# Patient Record
Sex: Female | Born: 1985 | Race: White | Hispanic: No | Marital: Single | Smoking: Current every day smoker
Health system: Southern US, Community
[De-identification: ages and names within clinical notes are randomized; demographics above are authoritative.]

## PROBLEM LIST (undated history)

## (undated) ENCOUNTER — Inpatient Hospital Stay: Payer: Self-pay

## (undated) DIAGNOSIS — K219 Gastro-esophageal reflux disease without esophagitis: Secondary | ICD-10-CM

## (undated) DIAGNOSIS — E119 Type 2 diabetes mellitus without complications: Secondary | ICD-10-CM

## (undated) HISTORY — PX: ABDOMINAL SURGERY: SHX537

## (undated) HISTORY — PX: CHOLECYSTECTOMY: SHX55

---
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
## 2006-08-23 ENCOUNTER — Emergency Department: Payer: Self-pay | Admitting: Emergency Medicine

## 2007-02-28 ENCOUNTER — Observation Stay: Payer: Self-pay | Admitting: Obstetrics and Gynecology

## 2007-03-08 ENCOUNTER — Observation Stay: Payer: Self-pay | Admitting: Obstetrics and Gynecology

## 2007-03-22 ENCOUNTER — Observation Stay: Payer: Self-pay | Admitting: Obstetrics and Gynecology

## 2007-04-06 ENCOUNTER — Inpatient Hospital Stay: Payer: Self-pay | Admitting: Obstetrics and Gynecology

## 2008-08-12 ENCOUNTER — Emergency Department: Payer: Self-pay | Admitting: Emergency Medicine

## 2009-01-15 ENCOUNTER — Emergency Department: Payer: Self-pay

## 2009-03-04 ENCOUNTER — Emergency Department: Payer: Self-pay | Admitting: Emergency Medicine

## 2010-01-21 ENCOUNTER — Emergency Department: Payer: Self-pay | Admitting: Emergency Medicine

## 2010-04-30 ENCOUNTER — Emergency Department: Payer: Self-pay | Admitting: Unknown Physician Specialty

## 2010-11-23 ENCOUNTER — Emergency Department: Payer: Self-pay | Admitting: *Deleted

## 2012-01-14 ENCOUNTER — Emergency Department: Payer: Self-pay | Admitting: Emergency Medicine

## 2012-01-14 LAB — BASIC METABOLIC PANEL
Anion Gap: 10 (ref 7–16)
Calcium, Total: 8.9 mg/dL (ref 8.5–10.1)
Co2: 22 mmol/L (ref 21–32)
Creatinine: 0.63 mg/dL (ref 0.60–1.30)
EGFR (African American): >60
EGFR (Non-African Amer.): >60
Osmolality: 280 (ref 275–301)

## 2012-01-14 LAB — URINALYSIS, COMPLETE
Bacteria: NONE SEEN
Bilirubin,UR: NEGATIVE
Nitrite: NEGATIVE
Specific Gravity: 1.02 (ref 1.003–1.030)
Squamous Epithelial: 22
Transitional Epi: 1

## 2012-01-14 LAB — CBC WITH DIFFERENTIAL/PLATELET
Basophil #: 0.1 10*3/uL (ref 0.0–0.1)
Eosinophil #: 0.2 10*3/uL (ref 0.0–0.7)
Eosinophil %: 1.8 %
MCH: 30.1 pg (ref 26.0–34.0)
MCHC: 34.2 g/dL (ref 32.0–36.0)
MCV: 88 fL (ref 80–100)
Monocyte %: 4.7 %
Neutrophil %: 63.3 %
RBC: 4.74 10*6/uL (ref 3.80–5.20)

## 2012-01-14 LAB — PREGNANCY, URINE: Pregnancy Test, Urine: NEGATIVE m[IU]/mL

## 2012-03-15 ENCOUNTER — Emergency Department: Payer: Self-pay | Admitting: Emergency Medicine

## 2012-03-16 LAB — CBC
HGB: 13.2 g/dL (ref 12.0–16.0)
MCH: 29 pg (ref 26.0–34.0)
MCHC: 33.4 g/dL (ref 32.0–36.0)
Platelet: 291 10*3/uL (ref 150–440)
RDW: 14 % (ref 11.5–14.5)
WBC: 13 10*3/uL — ABNORMAL HIGH (ref 3.6–11.0)

## 2012-03-16 LAB — BASIC METABOLIC PANEL
Anion Gap: 3 — ABNORMAL LOW (ref 7–16)
Calcium, Total: 8.6 mg/dL (ref 8.5–10.1)
Creatinine: 0.79 mg/dL (ref 0.60–1.30)
Glucose: 91 mg/dL (ref 65–99)
Osmolality: 277 (ref 275–301)
Sodium: 139 mmol/L (ref 136–145)

## 2013-02-26 ENCOUNTER — Emergency Department: Payer: Self-pay | Admitting: Emergency Medicine

## 2014-01-04 ENCOUNTER — Emergency Department: Payer: Self-pay | Admitting: Emergency Medicine

## 2014-01-05 LAB — TROPONIN I: Troponin-I: <0.02 ng/mL

## 2014-01-05 LAB — URINALYSIS, COMPLETE
Bacteria: NONE SEEN
Bilirubin,UR: NEGATIVE
Blood: NEGATIVE
GLUCOSE, UR: NEGATIVE mg/dL (ref 0–75)
Ketone: NEGATIVE
Leukocyte Esterase: NEGATIVE
NITRITE: NEGATIVE
PROTEIN: NEGATIVE
Ph: 6 (ref 4.5–8.0)
Specific Gravity: 1.003 (ref 1.003–1.030)
Squamous Epithelial: 2

## 2014-01-05 LAB — COMPREHENSIVE METABOLIC PANEL
ALBUMIN: 3.7 g/dL (ref 3.4–5.0)
ALT: 18 U/L
Alkaline Phosphatase: 79 U/L
Anion Gap: 7 (ref 7–16)
BUN: 12 mg/dL (ref 7–18)
Bilirubin,Total: 0.3 mg/dL (ref 0.2–1.0)
CALCIUM: 8.7 mg/dL (ref 8.5–10.1)
CREATININE: 0.7 mg/dL (ref 0.60–1.30)
Chloride: 108 mmol/L — ABNORMAL HIGH (ref 98–107)
Co2: 27 mmol/L (ref 21–32)
EGFR (African American): >60
EGFR (Non-African Amer.): >60
Glucose: 87 mg/dL (ref 65–99)
Osmolality: 282 (ref 275–301)
Potassium: 3.7 mmol/L (ref 3.5–5.1)
SGOT(AST): 20 U/L (ref 15–37)
SODIUM: 142 mmol/L (ref 136–145)
Total Protein: 7.3 g/dL (ref 6.4–8.2)

## 2014-01-05 LAB — CBC WITH DIFFERENTIAL/PLATELET
BASOS PCT: 0.3 %
Basophil #: 0 10*3/uL (ref 0.0–0.1)
EOS ABS: 0.3 10*3/uL (ref 0.0–0.7)
Eosinophil %: 2.4 %
HCT: 42.2 % (ref 35.0–47.0)
HGB: 13.9 g/dL (ref 12.0–16.0)
LYMPHS ABS: 3.7 10*3/uL — AB (ref 1.0–3.6)
Lymphocyte %: 30.4 %
MCH: 29.4 pg (ref 26.0–34.0)
MCHC: 32.9 g/dL (ref 32.0–36.0)
MCV: 89 fL (ref 80–100)
Monocyte #: 0.7 x10 3/mm (ref 0.2–0.9)
Monocyte %: 5.5 %
NEUTROS ABS: 7.5 10*3/uL — AB (ref 1.4–6.5)
Neutrophil %: 61.4 %
Platelet: 304 10*3/uL (ref 150–440)
RBC: 4.72 10*6/uL (ref 3.80–5.20)
RDW: 13.6 % (ref 11.5–14.5)
WBC: 12.3 10*3/uL — AB (ref 3.6–11.0)

## 2014-01-05 LAB — CK TOTAL AND CKMB (NOT AT ARMC)
CK, Total: 104 U/L (ref 26–192)
CK-MB: 1.5 ng/mL (ref 0.5–3.6)

## 2014-01-05 LAB — PROTIME-INR
INR: 1
PROTHROMBIN TIME: 12.8 s (ref 11.5–14.7)

## 2014-02-18 ENCOUNTER — Emergency Department: Payer: Self-pay | Admitting: Emergency Medicine

## 2014-09-11 ENCOUNTER — Encounter: Payer: Self-pay | Admitting: Emergency Medicine

## 2014-09-11 ENCOUNTER — Other Ambulatory Visit: Payer: Self-pay

## 2014-09-11 ENCOUNTER — Emergency Department: Payer: Self-pay

## 2014-09-11 ENCOUNTER — Emergency Department
Admission: EM | Admit: 2014-09-11 | Discharge: 2014-09-11 | Disposition: A | Discharge status: Home / Self Care | Payer: Self-pay | Attending: Emergency Medicine | Admitting: Emergency Medicine

## 2014-09-11 DIAGNOSIS — K219 Gastro-esophageal reflux disease without esophagitis: Secondary | ICD-10-CM | POA: Insufficient documentation

## 2014-09-11 DIAGNOSIS — Z720 Tobacco use: Secondary | ICD-10-CM | POA: Insufficient documentation

## 2014-09-11 LAB — CBC WITH DIFFERENTIAL/PLATELET
BASOS ABS: 0.1 10*3/uL (ref 0–0.1)
BASOS PCT: 2 %
EOS ABS: 0.2 10*3/uL (ref 0–0.7)
Eosinophils Relative: 3 %
HEMATOCRIT: 44.1 % (ref 35.0–47.0)
HEMOGLOBIN: 14.5 g/dL (ref 12.0–16.0)
Lymphocytes Relative: 31 %
Lymphs Abs: 2.6 10*3/uL (ref 1.0–3.6)
MCH: 29.2 pg (ref 26.0–34.0)
MCHC: 32.9 g/dL (ref 32.0–36.0)
MCV: 88.8 fL (ref 80.0–100.0)
Monocytes Absolute: 0.4 10*3/uL (ref 0.2–0.9)
Monocytes Relative: 4 %
NEUTROS ABS: 5.1 10*3/uL (ref 1.4–6.5)
NEUTROS PCT: 60 %
Platelets: 271 10*3/uL (ref 150–440)
RBC: 4.97 MIL/uL (ref 3.80–5.20)
RDW: 14 % (ref 11.5–14.5)
WBC: 8.4 10*3/uL (ref 3.6–11.0)

## 2014-09-11 LAB — COMPREHENSIVE METABOLIC PANEL
ALT: 14 U/L (ref 14–54)
ANION GAP: 7 (ref 5–15)
AST: 20 U/L (ref 15–41)
Albumin: 4 g/dL (ref 3.5–5.0)
Alkaline Phosphatase: 50 U/L (ref 38–126)
BILIRUBIN TOTAL: 0.7 mg/dL (ref 0.3–1.2)
BUN: 11 mg/dL (ref 6–20)
CO2: 26 mmol/L (ref 22–32)
Calcium: 9.1 mg/dL (ref 8.9–10.3)
Chloride: 106 mmol/L (ref 101–111)
Creatinine, Ser: 0.79 mg/dL (ref 0.44–1.00)
Glucose, Bld: 109 mg/dL — ABNORMAL HIGH (ref 65–99)
POTASSIUM: 4.2 mmol/L (ref 3.5–5.1)
Sodium: 139 mmol/L (ref 135–145)
TOTAL PROTEIN: 7.1 g/dL (ref 6.5–8.1)

## 2014-09-11 LAB — LIPASE, BLOOD: LIPASE: 21 U/L — AB (ref 22–51)

## 2014-09-11 LAB — FIBRIN DERIVATIVES D-DIMER (ARMC ONLY): FIBRIN DERIVATIVES D-DIMER (ARMC): 283 (ref 0–499)

## 2014-09-11 MED ORDER — SUCRALFATE 1 G PO TABS: 1.0000 g | ORAL_TABLET | Freq: Four times a day (QID) | ORAL | Status: DC

## 2014-09-11 MED ORDER — GI COCKTAIL ~~LOC~~
30.0000 mL | ORAL | Status: AC
Start: 2014-09-11 — End: 2014-09-11
  Administered 2014-09-11: 30 mL via ORAL
  Filled 2014-09-11: qty 30

## 2014-09-11 MED ORDER — RANITIDINE HCL 150 MG PO CAPS: 150.0000 mg | ORAL_CAPSULE | Freq: Two times a day (BID) | ORAL | Status: DC

## 2014-09-11 MED ORDER — FAMOTIDINE 20 MG PO TABS
40.0000 mg | ORAL_TABLET | Freq: Once | ORAL | Status: AC
  Administered 2014-09-11: 40 mg via ORAL
  Filled 2014-09-11: qty 2

## 2014-09-11 NOTE — ED Notes (Signed)
Pt presents with left sided chest pain started last night with vomiting.

## 2014-09-11 NOTE — Discharge Instructions (Signed)
Gastroesophageal Reflux Disease, Adult Gastroesophageal reflux disease (GERD) happens when acid from your stomach flows up into the esophagus. When acid comes in contact with the esophagus, the acid causes soreness (inflammation) in the esophagus. Over time, GERD may create small holes (ulcers) in the lining of the esophagus. CAUSES   Increased body weight. This puts pressure on the stomach, making acid rise from the stomach into the esophagus.  Smoking. This increases acid production in the stomach.  Drinking alcohol. This causes decreased pressure in the lower esophageal sphincter (valve or ring of muscle between the esophagus and stomach), allowing acid from the stomach into the esophagus.  Late evening meals and a full stomach. This increases pressure and acid production in the stomach.  A malformed lower esophageal sphincter. Sometimes, no cause is found. SYMPTOMS   Burning pain in the lower part of the mid-chest behind the breastbone and in the mid-stomach area. This may occur twice a week or more often.  Trouble swallowing.  Sore throat.  Dry cough.  Asthma-like symptoms including chest tightness, shortness of breath, or wheezing. DIAGNOSIS  Your caregiver may be able to diagnose GERD based on your symptoms. In some cases, X-rays and other tests may be done to check for complications or to check the condition of your stomach and esophagus. TREATMENT  Your caregiver may recommend over-the-counter or prescription medicines to help decrease acid production. Ask your caregiver before starting or adding any new medicines.  HOME CARE INSTRUCTIONS   Change the factors that you can control. Ask your caregiver for guidance concerning weight loss, quitting smoking, and alcohol consumption.  Avoid foods and drinks that make your symptoms worse, such as:  Caffeine or alcoholic drinks.  Chocolate.  Peppermint or mint flavorings.  Garlic and onions.  Spicy foods.  Citrus fruits,  such as oranges, lemons, or limes.  Tomato-based foods such as sauce, chili, salsa, and pizza.  Fried and fatty foods.  Avoid lying down for the 3 hours prior to your bedtime or prior to taking a nap.  Eat small, frequent meals instead of large meals.  Wear loose-fitting clothing. Do not wear anything tight around your waist that causes pressure on your stomach.  Raise the head of your bed 6 to 8 inches with wood blocks to help you sleep. Extra pillows will not help.  Only take over-the-counter or prescription medicines for pain, discomfort, or fever as directed by your caregiver.  Do not take aspirin, ibuprofen, or other nonsteroidal anti-inflammatory drugs (NSAIDs). SEEK IMMEDIATE MEDICAL CARE IF:   You have pain in your arms, neck, jaw, teeth, or back.  Your pain increases or changes in intensity or duration.  You develop nausea, vomiting, or sweating (diaphoresis).  You develop shortness of breath, or you faint.  Your vomit is green, yellow, black, or looks like coffee grounds or blood.  Your stool is red, bloody, or black. These symptoms could be signs of other problems, such as heart disease, gastric bleeding, or esophageal bleeding. MAKE SURE YOU:   Understand these instructions.  Will watch your condition.  Will get help right away if you are not doing well or get worse. Document Released: 10/09/2004 Document Revised: 03/24/2011 Document Reviewed: 07/19/2010 [employer] Patient Information 2015 [employer]. This information is not intended to replace advice given to you by your health care provider. Make sure you discuss any questions you have with your health care provider.  

## 2014-09-11 NOTE — ED Provider Notes (Signed)
[facility] Emergency Department Provider Note  ____________________________________________  Time seen: 9:50 AM  I have reviewed the triage vital signs and the nursing notes.   HISTORY  Chief Complaint Chest Pain    HPI Gina Foster is a 29 y.o. female who complains of pain in the left side of the chest that started yesterday around 3 PM. She had eaten a sandwich for lunch. She denies eating anything since then. She has had nausea with some intermittent vomiting as well, worse over night while lying flat. The pain is in the left inferior chest when she lies flat it feels like it's in her back. It is otherwise not radiating. She describes the pain as sharp and severe in intensity. She does state that it feels worse when she takes a deep breath, but does not feel short of breath. Denies any fever or chills or recent illness. Denies myalgias cough or runny nose.  No known medical problems. Denies any history of GERD.     History reviewed. No pertinent past medical history.  There are no active problems to display for this patient.   Past Surgical History  Procedure Laterality Date  . Cholecystectomy    . Abdominal surgery      Current Outpatient Rx  Name  Route  Sig  Dispense  Refill  . ranitidine (ZANTAC) 150 MG capsule   Oral   Take 1 capsule (150 mg total) by mouth 2 (two) times daily.   28 capsule   0   . sucralfate (CARAFATE) 1 G tablet   Oral   Take 1 tablet (1 g total) by mouth 4 (four) times daily.   120 tablet   1    None Allergies Review of patient's allergies indicates no known allergies.  No family history on file.  Social History Social History  Substance Use Topics  . Smoking status: Current Every Day Smoker  . Smokeless tobacco: None  . Alcohol Use: No    Review of Systems  Constitutional: No fever or chills. No weight changes Eyes:No blurry vision or double vision.  ENT: No sore throat. Cardiovascular:  Positive chest pain as above. Respiratory: No dyspnea or cough. Gastrointestinal: Negative for abdominal pain, vomiting and diarrhea.  No BRBPR or melena. Genitourinary: Negative for dysuria, urinary retention, bloody urine, or difficulty urinating. Musculoskeletal: Negative for back pain. No joint swelling or pain. Skin: Negative for rash. Neurological: Negative for headaches, focal weakness or numbness. Psychiatric:No anxiety or depression.   Endocrine:No hot/cold intolerance, changes in energy, or sleep difficulty.  10-point ROS otherwise negative.  ____________________________________________   PHYSICAL EXAM:  VITAL SIGNS: ED Triage Vitals  Enc Vitals Group     BP 09/11/14 0947 122/84 mmHg     Pulse Rate 09/11/14 0947 81     Resp 09/11/14 0947 20     Temp 09/11/14 0947 98.2 F (36.8 C)     Temp Source 09/11/14 0947 Oral     SpO2 09/11/14 0947 99 %     Weight 09/11/14 0945 218 lb (98.884 kg)     Height 09/11/14 0945  (1.753 m)     Head Cir --      Peak Flow --      Pain Score 09/11/14 0945 7     Pain Loc --      Pain Edu? --      Excl. in GC? --      Constitutional: Alert and oriented. Well appearing and in no distress. Eyes:  No scleral icterus. No conjunctival pallor. PERRL. EOMI ENT   Head: Normocephalic and atraumatic.   Nose: No congestion/rhinnorhea. No septal hematoma   Mouth/Throat: MMM, mild pharyngeal erythema. No peritonsillar mass. No uvula shift.   Neck: No stridor. No SubQ emphysema. No meningismus. Hematological/Lymphatic/Immunilogical: No cervical lymphadenopathy. Cardiovascular: RRR. Normal and symmetric distal pulses are present in all extremities. No murmurs, rubs, or gallops. Respiratory: Normal respiratory effort without tachypnea nor retractions. Breath sounds are clear and equal bilaterally. No wheezes/rales/rhonchi. Gastrointestinal: Soft with left upper quadrant tenderness. No distention. There is no CVA tenderness.  No  rebound, rigidity, or guarding. Genitourinary: deferred Musculoskeletal: Nontender with normal range of motion in all extremities. No joint effusions.  No lower extremity tenderness.  No edema. Neurologic:   Normal speech and language.  CN 2-10 normal. Motor grossly intact. No pronator drift.  Normal gait. No gross focal neurologic deficits are appreciated.  Skin:  Skin is warm, dry and intact. No rash noted.  No petechiae, purpura, or bullae. Psychiatric: Mood and affect are normal. Speech and behavior are normal. Patient exhibits appropriate insight and judgment.  ____________________________________________    LABS (pertinent positives/negatives) (all labs ordered are listed, but only abnormal results are displayed) Labs Reviewed  COMPREHENSIVE METABOLIC PANEL - Abnormal; Notable for the following:    Glucose, Bld 109 (*)    All other components within normal limits  LIPASE, BLOOD - Abnormal; Notable for the following:    Lipase 21 (*)    All other components within normal limits  CBC WITH DIFFERENTIAL/PLATELET  FIBRIN DERIVATIVES D-DIMER ([facility] ONLY)   ____________________________________________   EKG  Interpreted by me  Date: 09/11/2014  Rate: 79  Rhythm: normal sinus rhythm  QRS Axis: normal  Intervals: normal  ST/T Wave abnormalities: normal  Conduction Disutrbances: none  Narrative Interpretation: unremarkable      ____________________________________________    RADIOLOGY  Chest x-ray unremarkable  ____________________________________________   PROCEDURES  ____________________________________________   INITIAL IMPRESSION / ASSESSMENT AND PLAN / ED COURSE  Pertinent labs & imaging results that were available during my care of the patient were reviewed by me and considered in my medical decision making (see chart for details).  Patient presents with left lower chest pain that is highly suspicious for GERD. We'll check a chest x-ray as well as  labs and a d-dimer given the pleuritic nature of the pain. No evidence of lower extremity DVT or other VTE source. We will give her a GI cocktail and Pepcid in the meantime which I think will help her symptoms. ----------------------------------------- 11:25 AM on 09/11/2014 -----------------------------------------  Vital signs remain normal. Workup is unremarkable. D-dimer is negative. We'll discharge the patient on a course of antacids and have her follow-up with primary care as needed. Low suspicion of ACS PE TAD pneumothorax carditis mediastinitis pneumonia or sepsis. Very low suspicion of AAA perforation bowel obstruction and cholangitis or other biliary pathology. ____________________________________________   FINAL CLINICAL IMPRESSION(S) / ED DIAGNOSES  Final diagnoses:  Gastroesophageal reflux disease without esophagitis      Sharman Cheek, MD 09/11/14 1125

## 2015-03-29 ENCOUNTER — Encounter: Payer: Self-pay | Admitting: *Deleted

## 2015-03-29 DIAGNOSIS — F17200 Nicotine dependence, unspecified, uncomplicated: Secondary | ICD-10-CM | POA: Insufficient documentation

## 2015-03-29 DIAGNOSIS — R112 Nausea with vomiting, unspecified: Secondary | ICD-10-CM | POA: Insufficient documentation

## 2015-03-29 LAB — CBC
HCT: 43.9 % (ref 35.0–47.0)
HEMOGLOBIN: 14.8 g/dL (ref 12.0–16.0)
MCH: 29.3 pg (ref 26.0–34.0)
MCHC: 33.6 g/dL (ref 32.0–36.0)
MCV: 87.2 fL (ref 80.0–100.0)
Platelets: 291 10*3/uL (ref 150–440)
RBC: 5.04 MIL/uL (ref 3.80–5.20)
RDW: 13.2 % (ref 11.5–14.5)
WBC: 10.8 10*3/uL (ref 3.6–11.0)

## 2015-03-29 LAB — COMPREHENSIVE METABOLIC PANEL
ALBUMIN: 4.1 g/dL (ref 3.5–5.0)
ALK PHOS: 67 U/L (ref 38–126)
ALT: 11 U/L — AB (ref 14–54)
ANION GAP: 6 (ref 5–15)
AST: 18 U/L (ref 15–41)
BUN: 10 mg/dL (ref 6–20)
CALCIUM: 9.4 mg/dL (ref 8.9–10.3)
CHLORIDE: 106 mmol/L (ref 101–111)
CO2: 28 mmol/L (ref 22–32)
Creatinine, Ser: 0.67 mg/dL (ref 0.44–1.00)
GFR calc non Af Amer: >60 mL/min (ref >60)
GLUCOSE: 95 mg/dL (ref 65–99)
Potassium: 3.4 mmol/L — ABNORMAL LOW (ref 3.5–5.1)
SODIUM: 140 mmol/L (ref 135–145)
Total Bilirubin: 0.5 mg/dL (ref 0.3–1.2)
Total Protein: 7.5 g/dL (ref 6.5–8.1)

## 2015-03-29 LAB — LIPASE, BLOOD: LIPASE: 21 U/L (ref 11–51)

## 2015-03-29 NOTE — ED Notes (Signed)
Pt reports she has nausea and vomiting for 1 days.  Pt reports body cramps today.  Pt alert.

## 2015-03-29 NOTE — ED Notes (Signed)
Pt unable to urinate at this moment,given cup for when is able to void.

## 2015-03-30 ENCOUNTER — Emergency Department
Admission: EM | Admit: 2015-03-30 | Discharge: 2015-03-30 | Disposition: A | Discharge status: Home / Self Care | Payer: Self-pay | Attending: Emergency Medicine | Admitting: Emergency Medicine

## 2015-03-30 DIAGNOSIS — R112 Nausea with vomiting, unspecified: Secondary | ICD-10-CM

## 2015-03-30 LAB — URINALYSIS COMPLETE WITH MICROSCOPIC (ARMC ONLY)
BILIRUBIN URINE: NEGATIVE
Glucose, UA: NEGATIVE mg/dL
Hgb urine dipstick: NEGATIVE
KETONES UR: NEGATIVE mg/dL
LEUKOCYTES UA: NEGATIVE
Nitrite: NEGATIVE
PH: 5 (ref 5.0–8.0)
Protein, ur: NEGATIVE mg/dL
RBC / HPF: NONE SEEN RBC/hpf (ref 0–5)
Specific Gravity, Urine: 1.026 (ref 1.005–1.030)

## 2015-03-30 LAB — PREGNANCY, URINE: Preg Test, Ur: NEGATIVE

## 2015-03-30 MED ORDER — SODIUM CHLORIDE 0.9 % IV BOLUS (SEPSIS)
1000.0000 mL | Freq: Once | INTRAVENOUS | Status: AC
  Administered 2015-03-30: 1000 mL via INTRAVENOUS

## 2015-03-30 MED ORDER — ONDANSETRON HCL 4 MG/2ML IJ SOLN
4.0000 mg | Freq: Once | INTRAMUSCULAR | Status: AC
  Administered 2015-03-30: 4 mg via INTRAVENOUS
  Filled 2015-03-30: qty 2

## 2015-03-30 NOTE — Discharge Instructions (Signed)
Nausea and Vomiting °Nausea is a sick feeling that often comes before throwing up (vomiting). Vomiting is a reflex where stomach contents come out of your mouth. Vomiting can cause severe loss of body fluids (dehydration). Children and elderly adults can become dehydrated quickly, especially if they also have diarrhea. Nausea and vomiting are symptoms of a condition or disease. It is important to find the cause of your symptoms. °CAUSES  °· Direct irritation of the stomach lining. This irritation can result from increased acid production (gastroesophageal reflux disease), infection, food poisoning, taking certain medicines (such as nonsteroidal anti-inflammatory drugs), alcohol use, or tobacco use. °· Signals from the brain. These signals could be caused by a headache, heat exposure, an inner ear disturbance, increased pressure in the brain from injury, infection, a tumor, or a concussion, pain, emotional stimulus, or metabolic problems. °· An obstruction in the gastrointestinal tract (bowel obstruction). °· Illnesses such as diabetes, hepatitis, gallbladder problems, appendicitis, kidney problems, cancer, sepsis, atypical symptoms of a heart attack, or eating disorders. °· Medical treatments such as chemotherapy and radiation. °· Receiving medicine that makes you sleep (general anesthetic) during surgery. °DIAGNOSIS °Your caregiver may ask for tests to be done if the problems do not improve after a few days. Tests may also be done if symptoms are severe or if the reason for the nausea and vomiting is not clear. Tests may include: °· Urine tests. °· Blood tests. °· Stool tests. °· Cultures (to look for evidence of infection). °· X-rays or other imaging studies. °Test results can help your caregiver make decisions about treatment or the need for additional tests. °TREATMENT °You need to stay well hydrated. Drink frequently but in small amounts. You may wish to drink water, sports drinks, clear broth, or eat frozen  ice pops or gelatin dessert to help stay hydrated. When you eat, eating slowly may help prevent nausea. There are also some antinausea medicines that may help prevent nausea. °HOME CARE INSTRUCTIONS  °· Take all medicine as directed by your caregiver. °· If you do not have an appetite, do not force yourself to eat. However, you must continue to drink fluids. °· If you have an appetite, eat a normal diet unless your caregiver tells you differently. °¨ Eat a variety of complex carbohydrates (rice, wheat, potatoes, bread), lean meats, yogurt, fruits, and vegetables. °¨ Avoid high-fat foods because they are more difficult to digest. °· Drink enough water and fluids to keep your urine clear or pale yellow. °· If you are dehydrated, ask your caregiver for specific rehydration instructions. Signs of dehydration may include: °¨ Severe thirst. °¨ Dry lips and mouth. °¨ Dizziness. °¨ Dark urine. °¨ Decreasing urine frequency and amount. °¨ Confusion. °¨ Rapid breathing or pulse. °SEEK IMMEDIATE MEDICAL CARE IF:  °· You have blood or brown flecks (like coffee grounds) in your vomit. °· You have black or bloody stools. °· You have a severe headache or stiff neck. °· You are confused. °· You have severe abdominal pain. °· You have chest pain or trouble breathing. °· You do not urinate at least once every 8 hours. °· You develop cold or clammy skin. °· You continue to vomit for longer than 24 to 48 hours. °· You have a fever. °MAKE SURE YOU:  °· Understand these instructions. °· Will watch your condition. °· Will get help right away if you are not doing well or get worse. °  °This information is not intended to replace advice given to you by your health care provider. Make sure   you discuss any questions you have with your health care provider.   Document Released: 12/30/2004 Document Revised: 03/24/2011 Document Reviewed: 05/29/2010 Elsevier Interactive Patient Education 2016 [employer]  Try the Zofran melt on your  tongue 13 times a day if needed for further nausea. If that does not keep you from vomiting or nausea continues or gets worse or you develop a fever or diarrhea please return or follow-up with your doctor. (If you just get diarrhea and are able to keep down fluid she can try Pepto-Bismol before you see the doctor.)

## 2015-03-30 NOTE — ED Notes (Signed)
Pt discharged to home.  Family member driving.  Discharge instructions reviewed.  Verbalized understanding.  No questions or concerns at this time.  Teach back verified.  Pt in NAD.  No items left in ED.   

## 2015-03-30 NOTE — ED Provider Notes (Signed)
[facility] Emergency Department Provider Note  ____________________________________________  Time seen: Approximately 4:45 AM  I have reviewed the triage vital signs and the nursing notes.   HISTORY  Chief Complaint Emesis    HPI Gina Foster is a 30 y.o. female who reports vomiting and nausea starting about 7 or 8 this morning. Patient has not had any diarrhea. She's worse with the nausea and vomiting if she moves around. She has not had any nausea vomiting since sitting here in the ER. Patient has no pain no fever no other complaints. Patient finished amoxicillin a week ago for strep throat.  No past medical history on file.  There are no active problems to display for this patient.   Past Surgical History  Procedure Laterality Date  . Cholecystectomy    . Abdominal surgery      Current Outpatient Rx  Name  Route  Sig  Dispense  Refill  . ranitidine (ZANTAC) 150 MG capsule   Oral   Take 1 capsule (150 mg total) by mouth 2 (two) times daily.   28 capsule   0   . sucralfate (CARAFATE) 1 G tablet   Oral   Take 1 tablet (1 g total) by mouth 4 (four) times daily.   120 tablet   1     Allergies Review of patient's allergies indicates no known allergies.  No family history on file.  Social History Social History  Substance Use Topics  . Smoking status: Current Every Day Smoker  . Smokeless tobacco: None  . Alcohol Use: No    Review of Systems Constitutional: No fever/chills Eyes: No visual changes. ENT: No sore throat. Cardiovascular: Denies chest pain. Respiratory: Denies shortness of breath. Gastrointestinal: See history of present illness Genitourinary: Negative for dysuria. Musculoskeletal: Negative for back pain. Skin: Negative for rash. Neurological: Negative for headaches, focal weakness or numbness.  10-point ROS otherwise negative.  ____________________________________________   PHYSICAL EXAM:  VITAL  SIGNS: ED Triage Vitals  Enc Vitals Group     BP 03/29/15 2117 137/81 mmHg     Pulse Rate 03/29/15 2117 77     Resp 03/29/15 2117 20     Temp 03/29/15 2117 98.3 F (36.8 C)     Temp Source 03/29/15 2117 Oral     SpO2 03/29/15 2117 99 %     Weight 03/29/15 2117 204 lb (92.534 kg)     Height 03/29/15 2117 5\' 9"  (1.753 m)     Head Cir --      Peak Flow --      Pain Score 03/29/15 2118 8     Pain Loc --      Pain Edu? --      Excl. in GC? --     Constitutional: Alert and oriented. Well appearing and in no acute distress. Eyes: Conjunctivae are normal. PERRL. EOMI. Head: Atraumatic. Nose: No congestion/rhinnorhea. Mouth/Throat: Mucous membranes are moist.  Oropharynx non-erythematous. Neck: No stridor.   Cardiovascular: Normal rate, regular rhythm. Grossly normal heart sounds.  Good peripheral circulation. Respiratory: Normal respiratory effort.  No retractions. Lungs CTAB. Gastrointestinal: Soft and nontender. No distention. No abdominal bruits. No CVA tenderness. Musculoskeletal: No lower extremity tenderness nor edema.  No joint effusions. Neurologic:  Normal speech and language. No gross focal neurologic deficits are appreciated. No gait instability. Skin:  Skin is warm, dry and intact. No rash noted. Psychiatric: Mood and affect are normal. Speech and behavior are normal.  ____________________________________________   LABS (all labs  ordered are listed, but only abnormal results are displayed)  Labs Reviewed  COMPREHENSIVE METABOLIC PANEL - Abnormal; Notable for the following:    Potassium 3.4 (*)    ALT 11 (*)    All other components within normal limits  URINALYSIS COMPLETEWITH MICROSCOPIC ([facility] ONLY) - Abnormal; Notable for the following:    Color, Urine YELLOW (*)    APPearance HAZY (*)    Bacteria, UA RARE (*)    Squamous Epithelial / LPF 0-5 (*)    All other components within normal limits  LIPASE, BLOOD  CBC  PREGNANCY, URINE    ____________________________________________  EKG   ____________________________________________  RADIOLOGY  ____________________________________________   PROCEDURES   ____________________________________________   INITIAL IMPRESSION / ASSESSMENT AND PLAN / ED COURSE  Pertinent labs & imaging results that were available during my care of the patient were reviewed by me and considered in my medical decision making (see chart for details).  Patient feels somewhat better after IV fluid. ____________________________________________   FINAL CLINICAL IMPRESSION(S) / ED DIAGNOSES  Final diagnoses:  Non-intractable vomiting with nausea, vomiting of unspecified type      Arnaldo Natal, MD 03/30/15 [phone]

## 2015-06-27 ENCOUNTER — Emergency Department
Admission: EM | Admit: 2015-06-27 | Discharge: 2015-06-27 | Disposition: A | Discharge status: Home / Self Care | Payer: Self-pay | Attending: Emergency Medicine | Admitting: Emergency Medicine

## 2015-06-27 DIAGNOSIS — Z79899 Other long term (current) drug therapy: Secondary | ICD-10-CM | POA: Insufficient documentation

## 2015-06-27 DIAGNOSIS — T63391A Toxic effect of venom of other spider, accidental (unintentional), initial encounter: Secondary | ICD-10-CM | POA: Insufficient documentation

## 2015-06-27 DIAGNOSIS — F17200 Nicotine dependence, unspecified, uncomplicated: Secondary | ICD-10-CM | POA: Insufficient documentation

## 2015-06-27 DIAGNOSIS — T63331A Toxic effect of venom of brown recluse spider, accidental (unintentional), initial encounter: Secondary | ICD-10-CM

## 2015-06-27 MED ORDER — HYDROCODONE-ACETAMINOPHEN 5-325 MG PO TABS
1.0000 | ORAL_TABLET | Freq: Once | ORAL | Status: AC
  Administered 2015-06-27: 1 via ORAL
  Filled 2015-06-27: qty 1

## 2015-06-27 MED ORDER — CEPHALEXIN 500 MG PO CAPS: 500.0000 mg | ORAL_CAPSULE | Freq: Four times a day (QID) | ORAL | Status: DC

## 2015-06-27 MED ORDER — HYDROCODONE-ACETAMINOPHEN 5-325 MG PO TABS: 1.0000 | ORAL_TABLET | ORAL | Status: DC | PRN

## 2015-06-27 NOTE — ED Notes (Signed)
Pt presents to ED after what she thinks is a spider bite. Felt something crawling in her bra. Pt states knot on R breast, now has spread across R breast. Took tylenol and benaryl because she didn't want to have an allergic reaction, stated that the bite itched "real bad at first, that's why it took the benadryl."

## 2015-06-27 NOTE — ED Provider Notes (Signed)
[facility] Emergency Department Provider Note  ____________________________________________  Time seen: Approximately 8:42 PM  I have reviewed the triage vital signs and the nursing notes.   HISTORY  Chief Complaint Insect Bite    HPI Gina Foster is a 30 y.o. female who presents emergency department complaining of a possible spider bite to the right breast. Patient states that she felt something crawling in normal, she wretched a side and didn't think anything of it initially. Patient states that she felt some tenderness and swelling to the region and on exam felt a "knot" to the right breast. Patient states that the swelling, redness, pain has increased. Patient initially reported some itching to the region before pain. She took Benadryl for symptoms but states that this did not improve same. Patient denies any recent skin infections. She denies any other complaints. She denies any headache, visual changes, neck pain, chest pain, shortness of breath, nausea or vomiting, fever or chills.   History reviewed. No pertinent past medical history.  There are no active problems to display for this patient.   Past Surgical History  Procedure Laterality Date  . Cholecystectomy    . Abdominal surgery      Current Outpatient Rx  Name  Route  Sig  Dispense  Refill  . cephALEXin (KEFLEX) 500 MG capsule   Oral   Take 1 capsule (500 mg total) by mouth 4 (four) times daily.   28 capsule   0   . HYDROcodone-acetaminophen (NORCO/VICODIN) 5-325 MG tablet   Oral   Take 1 tablet by mouth every 4 (four) hours as needed for moderate pain.   20 tablet   0   . ranitidine (ZANTAC) 150 MG capsule   Oral   Take 1 capsule (150 mg total) by mouth 2 (two) times daily.   28 capsule   0   . sucralfate (CARAFATE) 1 G tablet   Oral   Take 1 tablet (1 g total) by mouth 4 (four) times daily.   120 tablet   1     Allergies Review of patient's allergies indicates  no known allergies.  History reviewed. No pertinent family history.  Social History Social History  Substance Use Topics  . Smoking status: Current Every Day Smoker  . Smokeless tobacco: None  . Alcohol Use: No     Review of Systems  Constitutional: No fever/chills Eyes: No visual changes.  Cardiovascular: no chest pain. Respiratory: no cough. No SOB. Gastrointestinal: No abdominal pain.  No nausea, no vomiting.   Musculoskeletal: Negative for musculoskeletal pain. Skin: Negative for rash, abrasions, lacerations, ecchymosis.Positive for lesion/possible spider bite to the right breast. Neurological: Negative for headaches, focal weakness or numbness. 10-point ROS otherwise negative.  ____________________________________________   PHYSICAL EXAM:  VITAL SIGNS: ED Triage Vitals  Enc Vitals Group     BP 06/27/15 1934 136/93 mmHg     Pulse Rate 06/27/15 1934 94     Resp 06/27/15 1934 20     Temp 06/27/15 1934 98.9 F (37.2 C)     Temp Source 06/27/15 1934 Oral     SpO2 06/27/15 1934 100 %     Weight --      Height --      Head Cir --      Peak Flow --      Pain Score 06/27/15 1937 6     Pain Loc --      Pain Edu? --      Excl. in GC? --  Constitutional: Alert and oriented. Well appearing and in no acute distress. Eyes: Conjunctivae are normal. PERRL. EOMI. Head: Atraumatic. Hematological/Lymphatic/Immunilogical: No cervical lymphadenopathy. Cardiovascular: Normal rate, regular rhythm. Normal S1 and S2.  Good peripheral circulation. Respiratory: Normal respiratory effort without tachypnea or retractions. Lungs CTAB. Good air entry to the bases with no decreased or absent breath sounds. Gastrointestinal: Bowel sounds 4 quadrants. Soft and nontender to palpation. No guarding or rigidity. No palpable masses. No distention. No CVA tenderness. Musculoskeletal: Full range of motion to all extremities. No gross deformities appreciated. Neurologic:  Normal speech and  language. No gross focal neurologic deficits are appreciated.  Skin:  Skin is warm, dry and intact. No rash noted.Erythematous and edematous skin lesion is noted to the right inferior breast. No visible bite mark. No central necrosis or drainage is appreciated. Area is erythematous, non-warm to touch, mildly tender to palpation. No fluctuance is palpated. No axillary lymphadenopathy. Psychiatric: Mood and affect are normal. Speech and behavior are normal. Patient exhibits appropriate insight and judgement.   ____________________________________________   LABS (all labs ordered are listed, but only abnormal results are displayed)  Labs Reviewed - No data to display ____________________________________________  EKG   ____________________________________________  RADIOLOGY   No results found.  ____________________________________________    PROCEDURES  Procedure(s) performed:       Medications  HYDROcodone-acetaminophen (NORCO/VICODIN) 5-325 MG per tablet 1 tablet (not administered)     ____________________________________________   INITIAL IMPRESSION / ASSESSMENT AND PLAN / ED COURSE  Pertinent labs & imaging results that were available during my care of the patient were reviewed by me and considered in my medical decision making (see chart for details).  Patient's diagnosis is consistent with bronchospasm. Patient's symptoms are consistent with the above diagnosis. Pattern is not typical with central necrosis or vesiculations but patient's history and presentation is consistent with brown recluse bite. Patient is given pain medicine here in the department. Patient will be discharged home with prescriptions for antibiotics and pain medication. Patient is to follow up with primary care provider as needed or otherwise directed. Patient is given ED precautions to return to the ED for any worsening or new  symptoms.     ____________________________________________  FINAL CLINICAL IMPRESSION(S) / ED DIAGNOSES  Final diagnoses:  Brown recluse spider bite, accidental or unintentional, initial encounter      NEW MEDICATIONS STARTED DURING THIS VISIT:  New Prescriptions   CEPHALEXIN (KEFLEX) 500 MG CAPSULE    Take 1 capsule (500 mg total) by mouth 4 (four) times daily.   HYDROCODONE-ACETAMINOPHEN (NORCO/VICODIN) 5-325 MG TABLET    Take 1 tablet by mouth every 4 (four) hours as needed for moderate pain.        This chart was dictated using voice recognition software/Dragon. Despite best efforts to proofread, errors can occur which can change the meaning. Any change was purely unintentional.    Racheal Patches, PA-C 06/27/15 2108  Sharman Cheek, MD 06/28/15 [phone]

## 2015-06-27 NOTE — Discharge Instructions (Signed)
Brown Recluse Spider Bite °Brown recluse spiders can inject poison (venom) into the wound when they bite a person. In most cases, a bite from a brown recluse spider causes mild redness and swelling around the bite. However, in rare cases, bites can be serious and even life threatening. °Brown recluse spiders can be dark brown to light tan in color. On their back, they have a band of darker color that is shaped like a violin. They are found mainly in the southeastern part of the [place] as far north as [place]. They may be found outdoors underneath items lying on the ground. They may also live indoors in places that are out of the way, such as attics. °CAUSES  °A spider bite is often caused by a person accidentally making contact with a spider in a way that traps the spider against the person's skin. °SYMPTOMS  °Symptoms of this condition may include: °· Pain and redness at the site of the bite. This may begin as a small, painful blister with redness around it. The blister may break open and create a sore (ulcer) that can get worse and spread over time. This may result in an area of tissue death up to 12 inches (30 cm) wide. °· A general feeling of sickness (malaise). °· Nausea or vomiting. °· Fever. °· Body aches. °Symptoms may get worse during several days after you are bitten. °DIAGNOSIS  °This condition may be diagnosed based on your symptoms and a physical exam. Your health care provider will ask about the history of your injury and any details you may have about the spider.  °TREATMENT  °There is no single cure (antidote) to treat this bite. Treatment will usually focus on caring for the wound. Options may include:  °· Covering the wound with a bandage (dressing). °· Medicines to treat the ulcer and help prevent tissue death. °· Antibiotic medicine. °· Tetanus shot. °· Surgery to remove damaged tissue. This may be needed if a large ulcer develops in the bite area. °HOME CARE INSTRUCTIONS  °Medicines  °· Take or  apply over-the-counter and prescription medicines only as told by your health care provider. °· If you were prescribed an antibiotic medicine, take or apply it as told by your health care provider. Do not stop using the antibiotic even if your condition improves. °Wound Care  °· Follow instructions from your health care provider about how to take care of your wound. Make sure you: °¨ Wash your hands with soap and water before you change your dressing. If soap and water are not available, use hand sanitizer. °¨ Change your dressing as told by your health care provider. °· Keep the bite area clean and dry. Wash the bite area daily with soap and water as told by your health care provider. °· Do not scratch the bite area. °General Instructions  °· If directed, apply ice to the bite area. °¨  Put ice in a plastic bag. °¨  Place a towel between your skin and the bag. °¨  Leave the ice on for 20 minutes, 2-3 times per day. °· Raise (elevate) the bite area above the level of your heart while you are sitting or lying down, if this is possible. °· Keep all follow-up visits as told by your health care provider. This is important. °SEEK MEDICAL CARE IF:  °· Your symptoms get worse or do not improve after 24 hours. °· You have increasing redness, swelling, or pain in the bite area. °SEEK IMMEDIATE MEDICAL CARE IF:  °· Your ulcer appears to   be getting larger or growing deeper. °· You have fluid, blood, or pus coming from the bite area. °· You have chills or a fever. °· You feel nauseous or you vomit. °· You have muscle aches. °· You feel unusually weak or tired. °· You have involuntary muscle movements (convulsions). °· You develop a rash. °· You urinate less frequently than usual. °· You have blood in your urine or other unusual bleeding. °· Your skin turns yellow. °  °This information is not intended to replace advice given to you by your health care provider. Make sure you discuss any questions you have with your health care  provider. °  °Document Released: 12/30/2004 Document Revised: 09/20/2014 Document Reviewed: 05/17/2014 °Elsevier Interactive Patient Education ©2016 [employer] ° °

## 2015-06-28 ENCOUNTER — Encounter: Payer: Self-pay | Admitting: Urgent Care

## 2015-06-28 ENCOUNTER — Emergency Department: Payer: Self-pay

## 2015-06-28 ENCOUNTER — Emergency Department
Admission: EM | Admit: 2015-06-28 | Discharge: 2015-06-29 | Disposition: A | Discharge status: Home / Self Care | Payer: Self-pay | Attending: Emergency Medicine | Admitting: Emergency Medicine

## 2015-06-28 DIAGNOSIS — N61 Mastitis without abscess: Secondary | ICD-10-CM | POA: Insufficient documentation

## 2015-06-28 DIAGNOSIS — T63331D Toxic effect of venom of brown recluse spider, accidental (unintentional), subsequent encounter: Secondary | ICD-10-CM

## 2015-06-28 DIAGNOSIS — F17200 Nicotine dependence, unspecified, uncomplicated: Secondary | ICD-10-CM | POA: Insufficient documentation

## 2015-06-28 DIAGNOSIS — T7840XA Allergy, unspecified, initial encounter: Secondary | ICD-10-CM

## 2015-06-28 DIAGNOSIS — T63301D Toxic effect of unspecified spider venom, accidental (unintentional), subsequent encounter: Secondary | ICD-10-CM | POA: Insufficient documentation

## 2015-06-28 HISTORY — DX: Gastro-esophageal reflux disease without esophagitis: K21.9

## 2015-06-28 MED ORDER — MORPHINE SULFATE (PF) 4 MG/ML IV SOLN
INTRAVENOUS | Status: AC
  Filled 2015-06-28: qty 1

## 2015-06-28 MED ORDER — DEXAMETHASONE SODIUM PHOSPHATE 10 MG/ML IJ SOLN
10.0000 mg | Freq: Once | INTRAMUSCULAR | Status: DC
  Filled 2015-06-28: qty 1

## 2015-06-28 MED ORDER — DIPHENHYDRAMINE HCL 50 MG/ML IJ SOLN
50.0000 mg | Freq: Once | INTRAMUSCULAR | Status: AC
  Administered 2015-06-29: 50 mg via INTRAVENOUS

## 2015-06-28 MED ORDER — CLINDAMYCIN PHOSPHATE 600 MG/50ML IV SOLN
600.0000 mg | Freq: Once | INTRAVENOUS | Status: AC
  Administered 2015-06-28: 600 mg via INTRAVENOUS
  Filled 2015-06-28: qty 50

## 2015-06-28 MED ORDER — MORPHINE SULFATE (PF) 4 MG/ML IV SOLN
4.0000 mg | Freq: Once | INTRAVENOUS | Status: AC
  Administered 2015-06-28: 4 mg via INTRAVENOUS

## 2015-06-28 MED ORDER — ONDANSETRON HCL 4 MG/2ML IJ SOLN
4.0000 mg | Freq: Once | INTRAMUSCULAR | Status: AC
  Administered 2015-06-28: 4 mg via INTRAVENOUS

## 2015-06-28 MED ORDER — DIPHENHYDRAMINE HCL 50 MG/ML IJ SOLN: 50.0000 mg | Freq: Once | INTRAMUSCULAR | Status: DC

## 2015-06-28 MED ORDER — ONDANSETRON HCL 4 MG/2ML IJ SOLN
INTRAMUSCULAR | Status: AC
  Filled 2015-06-28: qty 2

## 2015-06-28 MED ORDER — DEXAMETHASONE SODIUM PHOSPHATE 10 MG/ML IJ SOLN
10.0000 mg | Freq: Once | INTRAMUSCULAR | Status: AC
  Administered 2015-06-29: 10 mg via INTRAVENOUS

## 2015-06-28 MED ORDER — FAMOTIDINE IN NACL 20-0.9 MG/50ML-% IV SOLN: 20.0000 mg | Freq: Once | INTRAVENOUS | Status: DC

## 2015-06-28 NOTE — ED Notes (Signed)
Pt reports being seen yesterday for insect bite on right breast and being told it was a brown recluse bite. Pt was discharged with keflex and vicodin. Pt reports vicodin is not managing pain. Pt reports redness/swelling has doubled in size since yesterday. Pt reports pain has dramatically increased since yesterday. Pt has approx 5" area of redness around bite. Area is warm to touch

## 2015-06-28 NOTE — ED Notes (Signed)
Patient presents with worsening pain and swelling to her RIGHT breast. Of note, patient seen last night for the same - advised, per her report, that she had a "brown recluse bite". Patient given ABX and Vicodin and sent home. Presents tonight with worsening symptoms; ?? Cellulitis to her RIGHT breast from the bite. Denies fever and drainage. States, "The Vicodin is not touching the pain".

## 2015-06-28 NOTE — ED Provider Notes (Signed)
[facility] Emergency Department Provider Note  ____________________________________________  Time seen: Approximately 11:01 PM  I have reviewed the triage vital signs and the nursing notes.   HISTORY  Chief Complaint Insect Bite    HPI Gina Foster is a 30 y.o. female who returns to emergency department complaining of right breast pain. Patient was seen by myself yesterday. She endorsed having a sensation of an insect crawling around in her bra. Patient states that she flicked away without sig one it was. She felt a pinch/bite to that area. She started having swelling and pain to that region. Patient presented yesterday with erythema and edema to the lateral right breast. Due to patient's presentation and history she was diagnosed with a brown recluse bite and placed on antibiotics and pain medication. Patient states that she has been taking her antibiotics and pain medication but they have not resolved symptoms at this time. She denies any drainage from the site. She denies any drainage from her nipple. Patient denies any fevers or chills, chest pain, shortness of breath, nausea or vomiting.   Past Medical History  Diagnosis Date  . GERD (gastroesophageal reflux disease)     There are no active problems to display for this patient.   Past Surgical History  Procedure Laterality Date  . Cholecystectomy    . Abdominal surgery      Current Outpatient Rx  Name  Route  Sig  Dispense  Refill  . cephALEXin (KEFLEX) 500 MG capsule   Oral   Take 1 capsule (500 mg total) by mouth 4 (four) times daily.   28 capsule   0   . HYDROcodone-acetaminophen (NORCO/VICODIN) 5-325 MG tablet   Oral   Take 1 tablet by mouth every 4 (four) hours as needed for moderate pain.   20 tablet   0   . ranitidine (ZANTAC) 150 MG capsule   Oral   Take 1 capsule (150 mg total) by mouth 2 (two) times daily.   28 capsule   0   . sucralfate (CARAFATE) 1 G tablet   Oral    Take 1 tablet (1 g total) by mouth 4 (four) times daily.   120 tablet   1     Allergies Review of patient's allergies indicates no known allergies.  No family history on file.  Social History Social History  Substance Use Topics  . Smoking status: Current Every Day Smoker  . Smokeless tobacco: None  . Alcohol Use: No     Review of Systems  Constitutional: No fever/chills Cardiovascular: no chest pain. Respiratory: no cough. No SOB. Gastrointestinal: No abdominal pain.  No nausea, no vomiting.  Musculoskeletal: Negative for musculoskeletal pain. Skin: Positive for erythematous and edematous skin lesion to the right breast. Neurological: Negative for headaches, focal weakness or numbness. 10-point ROS otherwise negative.  ____________________________________________   PHYSICAL EXAM:  VITAL SIGNS: ED Triage Vitals  Enc Vitals Group     BP 06/28/15 2154 156/90 mmHg     Pulse Rate 06/28/15 2154 97     Resp 06/28/15 2154 16     Temp 06/28/15 2154 98.6 F (37 C)     Temp Source 06/28/15 2154 Oral     SpO2 06/28/15 2154 100 %     Weight 06/28/15 2154 210 lb (95.255 kg)     Height 06/28/15 2154 5\' 9"  (1.753 m)     Head Cir --      Peak Flow --      Pain Score  06/28/15 2155 8     Pain Loc --      Pain Edu? --      Excl. in GC? --      Constitutional: Alert and oriented. Well appearing and in no acute distress. Eyes: Conjunctivae are normal. PERRL. EOMI. Head: Atraumatic. Neck: No stridor.   Hematological/Lymphatic/Immunilogical: No cervical lymphadenopathy. Cardiovascular: Normal rate, regular rhythm. Normal S1 and S2.  Good peripheral circulation. Respiratory: Normal respiratory effort without tachypnea or retractions. Lungs CTAB. Good air entry to the bases with no decreased or absent breath sounds. Musculoskeletal: Full range of motion to all extremities. No gross deformities appreciated. Neurologic:  Normal speech and language. No gross focal neurologic  deficits are appreciated.  Skin:  Skin is warm, dry and intact. No rash noted. Erythematous and edematous skin lesion is noted to the right lateral breast. Area is firm to palpation. There is no necrosis or fasciculation. Area is firm to palpation. Area is painful to palpation. Area measures approximately 57 m in diameter. No fluctuance is noted. No drainage is noted. No axillary lymphadenopathy. No drainage from the nipple. Psychiatric: Mood and affect are normal. Speech and behavior are normal. Patient exhibits appropriate insight and judgement.   ____________________________________________   LABS (all labs ordered are listed, but only abnormal results are displayed)  Labs Reviewed  CBC WITH DIFFERENTIAL/PLATELET - Abnormal; Notable for the following:    WBC 12.1 (*)    Neutro Abs 7.3 (*)    All other components within normal limits  COMPREHENSIVE METABOLIC PANEL   ____________________________________________  EKG   ____________________________________________  RADIOLOGY   No results found.  ____________________________________________    PROCEDURES  Procedure(s) performed:       Medications  morphine 4 MG/ML injection (not administered)  ondansetron (ZOFRAN) 4 MG/2ML injection (not administered)  diphenhydrAMINE (BENADRYL) 50 MG/ML injection (not administered)  famotidine (PEPCID) 20 MG tablet (not administered)  clindamycin (CLEOCIN) IVPB 600 mg (600 mg Intravenous New Bag/Given 06/28/15 2348)  ondansetron (ZOFRAN) injection 4 mg (4 mg Intravenous Given 06/28/15 2348)  morphine 4 MG/ML injection 4 mg (4 mg Intravenous Given 06/28/15 2348)  diphenhydrAMINE (BENADRYL) injection 50 mg (50 mg Intravenous Given 06/29/15 0001)  dexamethasone (DECADRON) injection 10 mg (10 mg Intravenous Given 06/29/15 0001)     ____________________________________________   INITIAL IMPRESSION / ASSESSMENT AND PLAN / ED COURSE  Pertinent labs & imaging results that were available  during my care of the patient were reviewed by me and considered in my medical decision making (see chart for details).  ----------------------------------------- 11:43 PM on 06/28/2015 -----------------------------------------  Patient presents emergency department for continued complaints of swelling and pain to the right breast. Exam reveals no definitive changes from exam yesterday. There is still underlying cellulitis. No palpable fluctuance indicating abscess. No drainage from the site. However, since patient has returned, labs and imaging will be obtained to ensure that this is not a abscess to the breast. Patient will also be given a dose of IV antibiotics in the emergency department. She'll be given pain medication for symptom control.   ----------------------------------------- 11:55 PM on 06/28/2015 -----------------------------------------  Patient received first dose of morphine. She immediately began to feel pruritus and developed a wheal to the arm in which morphine was injected. Patient was immediately given 50 mg of diphenhydramine, 10 mg of Decadron. Patient denies any shortness of breath or difficulty breathing. Exam reveals no oropharyngeal swelling. Lungs are clear to auscultation bilaterally with no adventitious breath sounds. After administration of diphenhydramine and Decadron  patient denies any pruritus and wheal has resolved.    ----------------------------------------- 12:17 AM on 06/29/2015 -----------------------------------------  I discussed the case with Dr. Pershing Proud and will turn patient care over to him. At this time patient is receiving IV antibiotics, ultrasound, and basic labs. Providing that these returned with reassuring results patient will be discharged home with instructions to continue antibiotic therapy and pain medication.      ____________________________________________  FINAL CLINICAL IMPRESSION(S) / ED DIAGNOSES  Final diagnoses:   Cellulitis of female breast  Brown recluse spider bite, accidental or unintentional, subsequent encounter        This chart was dictated using voice recognition software/Dragon. Despite best efforts to proofread, errors can occur which can change the meaning. Any change was purely unintentional.    Racheal Patches, PA-C 06/29/15 0025  Myrna Blazer, MD 06/29/15 [phone]

## 2015-06-29 ENCOUNTER — Other Ambulatory Visit: Payer: Self-pay

## 2015-06-29 ENCOUNTER — Emergency Department: Payer: Self-pay

## 2015-06-29 LAB — CBC WITH DIFFERENTIAL/PLATELET
Basophils Absolute: 0.1 10*3/uL (ref 0–0.1)
Basophils Relative: 1 %
EOS ABS: 0.5 10*3/uL (ref 0–0.7)
Eosinophils Relative: 4 %
HEMATOCRIT: 39 % (ref 35.0–47.0)
HEMOGLOBIN: 13 g/dL (ref 12.0–16.0)
LYMPHS ABS: 3.5 10*3/uL (ref 1.0–3.6)
LYMPHS PCT: 29 %
MCH: 30 pg (ref 26.0–34.0)
MCHC: 33.4 g/dL (ref 32.0–36.0)
MCV: 89.8 fL (ref 80.0–100.0)
MONOS PCT: 6 %
Monocytes Absolute: 0.7 10*3/uL (ref 0.2–0.9)
NEUTROS ABS: 7.3 10*3/uL — AB (ref 1.4–6.5)
NEUTROS PCT: 60 %
Platelets: 258 10*3/uL (ref 150–440)
RBC: 4.34 MIL/uL (ref 3.80–5.20)
RDW: 13.6 % (ref 11.5–14.5)
WBC: 12.1 10*3/uL — ABNORMAL HIGH (ref 3.6–11.0)

## 2015-06-29 LAB — COMPREHENSIVE METABOLIC PANEL
ALT: 15 U/L (ref 14–54)
AST: 23 U/L (ref 15–41)
Albumin: 4 g/dL (ref 3.5–5.0)
Alkaline Phosphatase: 56 U/L (ref 38–126)
Anion gap: 9 (ref 5–15)
BUN: 15 mg/dL (ref 6–20)
CHLORIDE: 104 mmol/L (ref 101–111)
CO2: 22 mmol/L (ref 22–32)
CREATININE: 0.6 mg/dL (ref 0.44–1.00)
Calcium: 8.6 mg/dL — ABNORMAL LOW (ref 8.9–10.3)
GFR calc non Af Amer: >60 mL/min (ref >60)
Glucose, Bld: 85 mg/dL (ref 65–99)
POTASSIUM: 3.4 mmol/L — AB (ref 3.5–5.1)
SODIUM: 135 mmol/L (ref 135–145)
Total Bilirubin: 0.4 mg/dL (ref 0.3–1.2)
Total Protein: 7.5 g/dL (ref 6.5–8.1)

## 2015-06-29 MED ORDER — LIDOCAINE-EPINEPHRINE (PF) 1 %-1:200000 IJ SOLN
INTRAMUSCULAR | Status: AC
  Filled 2015-06-29: qty 30

## 2015-06-29 MED ORDER — SULFAMETHOXAZOLE-TRIMETHOPRIM 800-160 MG PO TABS: 2.0000 | ORAL_TABLET | Freq: Two times a day (BID) | ORAL | Status: DC

## 2015-06-29 MED ORDER — FAMOTIDINE 20 MG PO TABS
ORAL_TABLET | ORAL | Status: AC
  Filled 2015-06-29: qty 1

## 2015-06-29 MED ORDER — LIDOCAINE-EPINEPHRINE 2 %-1:100000 IJ SOLN: 30.0000 mL | Freq: Once | INTRAMUSCULAR | Status: DC

## 2015-06-29 MED ORDER — DIPHENHYDRAMINE HCL 50 MG/ML IJ SOLN
INTRAMUSCULAR | Status: AC
  Filled 2015-06-29: qty 1

## 2015-06-29 NOTE — ED Provider Notes (Signed)
Patient transferred to the major side from Flex care secondary to concern for breast abscess. Physical Exam  BP 102/60 mmHg  Pulse 67  Temp(Src) 98.6 F (37 C) (Oral)  Resp 21  Ht 5\' 9"  (1.753 m)  Wt 210 lb (95.255 kg)  BMI 31.00 kg/m2  SpO2 97%  LMP 06/13/2015      US BREAST LTD UNI RIGHT INC AXILLA (Final result) Result time: 06/29/15 01:17:56   Final result by Rad Results In Interface (06/29/15 01:17:56)   Narrative:   CLINICAL DATA: Cellulitis in the lateral right breast. Hit by brown work loose spider 1 day ago.  EXAM: ULTRASOUND OF THE right BREAST  COMPARISON: None  FINDINGS: Targeted ultrasound is performed, showing a complex collection in the lateral right breast at the 10 o'clock position, corresponding to palpable area. Lesion measures 2.5 x 1.5 x 2.4 cm. Flow is demonstrated around the peripheral aspect of the lesion. Incidental note of an adjacent circumscribed benign-appearing cyst medial to the lesion measuring about 5 mm maximal diameter.  IMPRESSION: Complex fluid collection in the lateral right breast consistent with abscess.  RECOMMENDATION: This represents emergent limited ultrasound examination of the right breast without the benefit of physical examination or mammography correlation. Recommend follow-up with breast center for more comprehensive dedicated evaluation of the breast.  BI-RADS CATEGORY Two   Electronically Signed By: Burman NievesWilliam Stevens M.D. On: 06/29/2015 01:17        Physical Exam Right breast with the upper outer quadrant of the area low with tenderness as well as a small area of fluctuance about 1 cm, circular, in diameter. There is also a wheal of erythema surrounding this area of fluctuance    ED Course  Procedures INCISION AND DRAINAGE Performed by: Arelia LongestSchaevitz,  David M Consent: Verbal consent obtained. Risks and benefits: risks, benefits and alternatives were discussed Type: abscess  Body area: Right  breast  Anesthesia: local infiltration  Incision was made with a scalpel.  Local anesthetic: lidocaine 1% with epinephrine  Anesthetic total: 3 ml  Complexity: complex Blunt dissection to break up loculations  Drainage: purulent  Drainage amount: 4 mL's   Packing material: 1/4 in iodoform gauze  Patient tolerance: Patient tolerated the procedure well with no immediate complications.      MDM ----------------------------------------- 3:07 AM on 06/29/2015 -----------------------------------------  Patient feeling well after the procedure. I had previously discussed the case Dr. Excell Seltzerooper of surgery who recommends bedside drainage by me due to the small size of the abscess. I made a quarter inch incision with good drainage of pus as well as a small amount of blood. The patient feels much improved after the drainage. I'll be adding Bactrim to her antibiotic regimen. She is currently on Keflex. She also had an allergic reaction earlier in the evening to morphine. She is feeling much improved at this time. No rash. No difficulty with breathing. Given Decadron.      Myrna Blazeravid Matthew Schaevitz, MD 06/29/15 [phone]

## 2015-06-29 NOTE — ED Notes (Signed)
Pt began to have itching on right arm after morphine injection. PA Christiane HaJonathan informed. PA ordered benadryl and dexamethasone. Medication given. Pt reported decrease in itching sensation. Arm reddened, with 1 hive noted on forearm.

## 2015-12-02 ENCOUNTER — Encounter: Payer: Self-pay | Admitting: Emergency Medicine

## 2015-12-02 ENCOUNTER — Emergency Department
Admission: EM | Admit: 2015-12-02 | Discharge: 2015-12-02 | Disposition: A | Discharge status: Home / Self Care | Payer: Self-pay | Attending: Student in an Organized Health Care Education/Training Program | Admitting: Student in an Organized Health Care Education/Training Program

## 2015-12-02 ENCOUNTER — Emergency Department: Payer: Self-pay

## 2015-12-02 DIAGNOSIS — Z79899 Other long term (current) drug therapy: Secondary | ICD-10-CM | POA: Insufficient documentation

## 2015-12-02 DIAGNOSIS — O210 Mild hyperemesis gravidarum: Secondary | ICD-10-CM | POA: Insufficient documentation

## 2015-12-02 DIAGNOSIS — R109 Unspecified abdominal pain: Secondary | ICD-10-CM

## 2015-12-02 DIAGNOSIS — R1031 Right lower quadrant pain: Secondary | ICD-10-CM | POA: Insufficient documentation

## 2015-12-02 DIAGNOSIS — F17200 Nicotine dependence, unspecified, uncomplicated: Secondary | ICD-10-CM | POA: Insufficient documentation

## 2015-12-02 DIAGNOSIS — O26891 Other specified pregnancy related conditions, first trimester: Secondary | ICD-10-CM

## 2015-12-02 DIAGNOSIS — Z3A01 Less than 8 weeks gestation of pregnancy: Secondary | ICD-10-CM | POA: Insufficient documentation

## 2015-12-02 DIAGNOSIS — R102 Pelvic and perineal pain: Secondary | ICD-10-CM | POA: Insufficient documentation

## 2015-12-02 LAB — CBC WITH DIFFERENTIAL/PLATELET
BASOS PCT: 1 %
Basophils Absolute: 0.1 10*3/uL (ref 0–0.1)
EOS ABS: 0.3 10*3/uL (ref 0–0.7)
Eosinophils Relative: 3 %
HCT: 41.1 % (ref 35.0–47.0)
HEMOGLOBIN: 14.4 g/dL (ref 12.0–16.0)
Lymphocytes Relative: 33 %
Lymphs Abs: 3.8 10*3/uL — ABNORMAL HIGH (ref 1.0–3.6)
MCH: 30.7 pg (ref 26.0–34.0)
MCHC: 35 g/dL (ref 32.0–36.0)
MCV: 87.6 fL (ref 80.0–100.0)
MONOS PCT: 5 %
Monocytes Absolute: 0.5 10*3/uL (ref 0.2–0.9)
NEUTROS PCT: 58 %
Neutro Abs: 6.8 10*3/uL — ABNORMAL HIGH (ref 1.4–6.5)
Platelets: 310 10*3/uL (ref 150–440)
RBC: 4.69 MIL/uL (ref 3.80–5.20)
RDW: 13.7 % (ref 11.5–14.5)
WBC: 11.5 10*3/uL — AB (ref 3.6–11.0)

## 2015-12-02 LAB — COMPREHENSIVE METABOLIC PANEL
ALBUMIN: 4.2 g/dL (ref 3.5–5.0)
ALK PHOS: 61 U/L (ref 38–126)
ALT: 15 U/L (ref 14–54)
ANION GAP: 7 (ref 5–15)
AST: 20 U/L (ref 15–41)
BUN: 7 mg/dL (ref 6–20)
CALCIUM: 9.2 mg/dL (ref 8.9–10.3)
CHLORIDE: 103 mmol/L (ref 101–111)
CO2: 26 mmol/L (ref 22–32)
Creatinine, Ser: 0.6 mg/dL (ref 0.44–1.00)
GFR calc Af Amer: >60 mL/min (ref >60)
GFR calc non Af Amer: >60 mL/min (ref >60)
GLUCOSE: 99 mg/dL (ref 65–99)
POTASSIUM: 3.5 mmol/L (ref 3.5–5.1)
SODIUM: 136 mmol/L (ref 135–145)
Total Bilirubin: 0.6 mg/dL (ref 0.3–1.2)
Total Protein: 7.6 g/dL (ref 6.5–8.1)

## 2015-12-02 LAB — HCG, QUANTITATIVE, PREGNANCY: hCG, Beta Chain, Quant, S: 9437 m[IU]/mL — ABNORMAL HIGH (ref <5)

## 2015-12-02 LAB — URINALYSIS COMPLETE WITH MICROSCOPIC (ARMC ONLY)
Bacteria, UA: NONE SEEN
Bilirubin Urine: NEGATIVE
Glucose, UA: NEGATIVE mg/dL
HGB URINE DIPSTICK: NEGATIVE
KETONES UR: NEGATIVE mg/dL
LEUKOCYTES UA: NEGATIVE
NITRITE: NEGATIVE
PH: 5 (ref 5.0–8.0)
PROTEIN: NEGATIVE mg/dL
RBC / HPF: NONE SEEN RBC/hpf (ref 0–5)
SPECIFIC GRAVITY, URINE: 1.014 (ref 1.005–1.030)

## 2015-12-02 LAB — WET PREP, GENITAL
Clue Cells Wet Prep HPF POC: NONE SEEN
Sperm: NONE SEEN
Trich, Wet Prep: NONE SEEN
WBC, Wet Prep HPF POC: NONE SEEN
Yeast Wet Prep HPF POC: NONE SEEN

## 2015-12-02 LAB — CHLAMYDIA/NGC RT PCR (ARMC ONLY)

## 2015-12-02 LAB — POCT PREGNANCY, URINE: Preg Test, Ur: POSITIVE — AB

## 2015-12-02 MED ORDER — ACETAMINOPHEN 500 MG PO TABS
1000.0000 mg | ORAL_TABLET | Freq: Once | ORAL | Status: AC
  Administered 2015-12-02: 1000 mg via ORAL
  Filled 2015-12-02: qty 2

## 2015-12-02 MED ORDER — SODIUM CHLORIDE 0.9 % IV BOLUS (SEPSIS)
1000.0000 mL | Freq: Once | INTRAVENOUS | Status: AC
  Administered 2015-12-02: 1000 mL via INTRAVENOUS

## 2015-12-02 MED ORDER — DOXYLAMINE-PYRIDOXINE 10-10 MG PO TBEC: 1.0000 | DELAYED_RELEASE_TABLET | Freq: Two times a day (BID) | ORAL | 0 refills | Status: DC | PRN

## 2015-12-02 NOTE — ED Provider Notes (Addendum)
[facility] Emergency Department Provider Note    First MD Initiated Contact with Patient 12/02/15 1859     (approximate)  I have reviewed the triage vital signs and the nursing notes.   HISTORY  Chief Complaint Abdominal Pain    HPI Gina Foster is a 30 y.o. female G3 P1 presents with 1 week of progressively worsening nausea vomiting and right pelvic pain. Patient recently found that she was pregnant by home urine pregnancy test. States she is roughly 7-8 weeks by LMP. She denies any vaginal discharge or dysuria. No recent fevers. No diarrhea or constipation. States that she did have episode of spotting last week but is not currently. States the pain is worse with her legs straightened. She feels better in the fetal position.   Past Medical History:  Diagnosis Date  . GERD (gastroesophageal reflux disease)    History reviewed. No pertinent family history. Past Surgical History:  Procedure Laterality Date  . ABDOMINAL SURGERY    . CHOLECYSTECTOMY     There are no active problems to display for this patient.     Prior to Admission medications   Medication Sig Start Date End Date Taking? Authorizing Provider  cephALEXin (KEFLEX) 500 MG capsule Take 1 capsule (500 mg total) by mouth 4 (four) times daily. 06/27/15   Delorise Royals Cuthriell, PA-C  Doxylamine-Pyridoxine 10-10 MG TBEC Take 1 tablet by mouth every 12 (twelve) hours as needed. 12/02/15   Willy Eddy, MD  HYDROcodone-acetaminophen (NORCO/VICODIN) 5-325 MG tablet Take 1 tablet by mouth every 4 (four) hours as needed for moderate pain. 06/27/15   Delorise Royals Cuthriell, PA-C  ranitidine (ZANTAC) 150 MG capsule Take 1 capsule (150 mg total) by mouth 2 (two) times daily. Patient not taking: Reported on 06/29/2015 09/11/14   Sharman Cheek, MD  sucralfate (CARAFATE) 1 G tablet Take 1 tablet (1 g total) by mouth 4 (four) times daily. Patient not taking: Reported on 06/29/2015 09/11/14   Sharman Cheek, MD  sulfamethoxazole-trimethoprim (BACTRIM DS,SEPTRA DS) 800-160 MG tablet Take 2 tablets by mouth 2 (two) times daily. 06/29/15   Myrna Blazer, MD    Allergies Morphine and related    Social History Social History  Substance Use Topics  . Smoking status: Current Every Day Smoker  . Smokeless tobacco: Never Used  . Alcohol use No    Review of Systems Patient denies headaches, rhinorrhea, blurry vision, numbness, shortness of breath, chest pain, edema, cough, abdominal pain, nausea, vomiting, diarrhea, dysuria, fevers, rashes or hallucinations unless otherwise stated above in HPI. ____________________________________________   PHYSICAL EXAM:  VITAL SIGNS: Vitals:   12/02/15 1826 12/02/15 2132  BP: 125/77 108/73  Pulse: (!) 104 87  Resp: 16 16  Temp: 98.2 F (36.8 C) 98 F (36.7 C)    Constitutional: Alert and oriented.in no acute distress. Eyes: Conjunctivae are normal. PERRL. EOMI. Head: Atraumatic. Nose: No congestion/rhinnorhea. Mouth/Throat: Mucous membranes are moist.  Oropharynx non-erythematous. Neck: No stridor. Painless ROM. No cervical spine tenderness to palpation Hematological/Lymphatic/Immunilogical: No cervical lymphadenopathy. Cardiovascular: Normal rate, regular rhythm. Grossly normal heart sounds.  Good peripheral circulation. Respiratory: Normal respiratory effort.  No retractions. Lungs CTAB. Gastrointestinal: Soft and nontender. Mild right suprapubic ttp.  No distention. No abdominal bruits. No CVA tenderness. Genitourinary: no cmt, no adnexal masses or significant ttp Musculoskeletal: No lower extremity tenderness nor edema.  No joint effusions. Neurologic:  Normal speech and language. No gross focal neurologic deficits are appreciated. No gait instability. Skin:  Skin  is warm, dry and intact. No rash noted. Psychiatric: Mood and affect are normal. Speech and behavior are normal.  ____________________________________________    LABS (all labs ordered are listed, but only abnormal results are displayed)  Results for orders placed or performed during the hospital encounter of 12/02/15 (from the past 24 hour(s))  CBC with Differential     Status: Abnormal   Collection Time: 12/02/15  6:29 PM  Result Value Ref Range   WBC 11.5 (H) 3.6 - 11.0 K/uL   RBC 4.69 3.80 - 5.20 MIL/uL   Hemoglobin 14.4 12.0 - 16.0 g/dL   HCT 82.941.1 56.235.0 - 13.047.0 %   MCV 87.6 80.0 - 100.0 fL   MCH 30.7 26.0 - 34.0 pg   MCHC 35.0 32.0 - 36.0 g/dL   RDW 86.513.7 78.411.5 - 69.614.5 %   Platelets 310 150 - 440 K/uL   Neutrophils Relative % 58 %   Neutro Abs 6.8 (H) 1.4 - 6.5 K/uL   Lymphocytes Relative 33 %   Lymphs Abs 3.8 (H) 1.0 - 3.6 K/uL   Monocytes Relative 5 %   Monocytes Absolute 0.5 0.2 - 0.9 K/uL   Eosinophils Relative 3 %   Eosinophils Absolute 0.3 0 - 0.7 K/uL   Basophils Relative 1 %   Basophils Absolute 0.1 0 - 0.1 K/uL  Comprehensive metabolic panel     Status: None   Collection Time: 12/02/15  6:29 PM  Result Value Ref Range   Sodium 136 135 - 145 mmol/L   Potassium 3.5 3.5 - 5.1 mmol/L   Chloride 103 101 - 111 mmol/L   CO2 26 22 - 32 mmol/L   Glucose, Bld 99 65 - 99 mg/dL   BUN 7 6 - 20 mg/dL   Creatinine, Ser 2.950.60 0.44 - 1.00 mg/dL   Calcium 9.2 8.9 - 28.410.3 mg/dL   Total Protein 7.6 6.5 - 8.1 g/dL   Albumin 4.2 3.5 - 5.0 g/dL   AST 20 15 - 41 U/L   ALT 15 14 - 54 U/L   Alkaline Phosphatase 61 38 - 126 U/L   Total Bilirubin 0.6 0.3 - 1.2 mg/dL   GFR calc non Af Amer >60 >60 mL/min   GFR calc Af Amer >60 >60 mL/min   Anion gap 7 5 - 15  hCG, quantitative, pregnancy     Status: Abnormal   Collection Time: 12/02/15  6:29 PM  Result Value Ref Range   hCG, Beta Chain, Quant, S 9,437 (H) <5 mIU/mL  Urinalysis complete, with microscopic ([facility] only)     Status: Abnormal   Collection Time: 12/02/15  6:35 PM  Result Value Ref Range   Color, Urine YELLOW (A) YELLOW   APPearance CLEAR (A) CLEAR   Glucose, UA NEGATIVE NEGATIVE  mg/dL   Bilirubin Urine NEGATIVE NEGATIVE   Ketones, ur NEGATIVE NEGATIVE mg/dL   Specific Gravity, Urine 1.014 1.005 - 1.030   Hgb urine dipstick NEGATIVE NEGATIVE   pH 5.0 5.0 - 8.0   Protein, ur NEGATIVE NEGATIVE mg/dL   Nitrite NEGATIVE NEGATIVE   Leukocytes, UA NEGATIVE NEGATIVE   RBC / HPF NONE SEEN 0 - 5 RBC/hpf   WBC, UA 0-5 0 - 5 WBC/hpf   Bacteria, UA NONE SEEN NONE SEEN   Squamous Epithelial / LPF 0-5 (A) NONE SEEN   Mucous PRESENT   Pregnancy, urine POC     Status: Abnormal   Collection Time: 12/02/15  6:37 PM  Result Value Ref Range   Preg Test,  Ur POSITIVE (A) NEGATIVE  Wet prep, genital     Status: None   Collection Time: 12/02/15  7:15 PM  Result Value Ref Range   Yeast Wet Prep HPF POC NONE SEEN NONE SEEN   Trich, Wet Prep NONE SEEN NONE SEEN   Clue Cells Wet Prep HPF POC NONE SEEN NONE SEEN   WBC, Wet Prep HPF POC NONE SEEN NONE SEEN   Sperm NONE SEEN   Chlamydia/NGC rt PCR ([facility] only)     Status: None   Collection Time: 12/02/15  7:15 PM  Result Value Ref Range   Specimen source GC/Chlam ENDOCERVICAL    Chlamydia Tr  NOT DETECTED    UNABLE TO PERFORM TEST DUE TO IMPROPER SPECIMEN COLLECTION.  NOTIFIED RN Sloan LeiterALICIA GRANGER ON 12/02/15 [facility]   ____________________________________________  EKG____________________________________________  RADIOLOGY  I personally reviewed all radiographic images ordered to evaluate for the above acute complaints and reviewed radiology reports and findings.  These findings were personally discussed with the patient.  Please see medical record for radiology report.  ____________________________________________   PROCEDURES  Procedure(s) performed: none Procedures    Critical Care performed: no ____________________________________________   INITIAL IMPRESSION / ASSESSMENT AND PLAN / ED COURSE  Pertinent labs & imaging results that were available during my care of the patient were reviewed by me and considered in  my medical decision making (see chart for details).  DDX: ectopic, threatened ab, msk strain, pid, uti, appendicitis  Don Broachabitha J Mayford KnifeWilliams is a 30 y.o. who presents to the ED with above complaints. Patient is AFVSS in ED. Exam as above. Given current presentation have considered the above differential. Based on exam I'm concerned for possible ectopic versus hyperemesis gravidarum with muscle skeletal pain. Less consistent with appendicitis the patient is afebrile without significant leukocytosis and does not have any right lower quadrant abdominal pain. Pain seems to more located in the pelvis. We will order beta Quant as well as ultrasound to evaluate for ectopic. Patient will be provided with IV fluids for dehydration.  The patient will be placed on continuous pulse oximetry and telemetry for monitoring.  Laboratory evaluation will be sent to evaluate for the above complaints.     Clinical Course as of Dec 01 2136  Wynelle LinkSun Dec 02, 2015  2041 Pelvic exam with no cervical motion tenderness. No significant discharge. No left adnexal or right adnexal masses. Mild right adnexal tenderness without mass guarding or rebound tenderness  [PR]  2109 I discussed results of testing with patient showing normal intrauterine pregnancy without any evidence of free fluid in the pelvis or ovarian pathology. Patient states that her pain is improved. We did discuss that appendicitis does remain on the differential given the location of her abdominal pain and discuss that CT imaging would be needed for further diagnostic evaluation.  In my clinical opinion do not feel that CT imaging immediately indicated based on her abdominal exam she has no guarding or rebound tenderness is afebrile without significant leukocytosis. Patient is intelligent and has a good understanding of her medical conditions. She is able to follow-up in 12 hours for repeat abdominal exam and will prefer this over CT imaging at this time.  Patient was able to  tolerate PO and was able to ambulate with a steady gait.  Have discussed with the patient and available family all diagnostics and treatments performed thus far and all questions were answered to the best of my ability. The patient demonstrates understanding and agreement with plan.   [  PR]    Clinical Course User Index [PR] Willy Eddy, MD     ____________________________________________   FINAL CLINICAL IMPRESSION(S) / ED DIAGNOSES  Final diagnoses:  Abdominal pain in pregnancy, first trimester  RLQ abdominal pain  Hyperemesis affecting pregnancy, antepartum      NEW MEDICATIONS STARTED DURING THIS VISIT:  New Prescriptions   DOXYLAMINE-PYRIDOXINE 10-10 MG TBEC    Take 1 tablet by mouth every 12 (twelve) hours as needed.     Note:  This document was prepared using Dragon voice recognition software and may include unintentional dictation errors.     Willy Eddy, MD 12/02/15 2137    Willy Eddy, MD 12/02/15 2138

## 2015-12-02 NOTE — ED Notes (Signed)
Patient able to tolerate fluids/food with no issue. MD Roxan Hockeyobinson informed

## 2015-12-02 NOTE — ED Triage Notes (Signed)
States abd cramping - took preg test and was positive.

## 2015-12-02 NOTE — ED Notes (Signed)
Reviewed d/c instructions, follow-up care and prescription with patient. Pt verbalized understanding.  

## 2015-12-02 NOTE — ED Notes (Signed)
Patient transported to Ultrasound 

## 2015-12-02 NOTE — Discharge Instructions (Signed)
Please return and 12 hours for repeat abdominal exam. Return sooner should he develop fevers or worsening of pain. Follow-up with OB/GYN. Take Tylenol for pain.

## 2015-12-02 NOTE — ED Notes (Signed)
Patient given meal tray per MD Roxan Hockeyobinson request for PO challenge.

## 2016-01-14 NOTE — L&D Delivery Note (Signed)
[id number] Discharge instructions given pt verbalized understanding.  No questions comments or concerns voiced at this time.      0245 Pt. Discharged home ambulatory in stable conditions accompanied by mother and daughter.

## 2016-01-14 NOTE — L&D Delivery Note (Signed)
Pt to OBS 4 S/P Fall.

## 2016-01-14 NOTE — L&D Delivery Note (Signed)
EFM explained and applied at this time.  Pt stated that she fell on left side after slipping on some water.  Pt stated that she did not hit her abdomen at all.  Informed pt that we will monitor her for a little bit and call MD on call.

## 2016-01-23 ENCOUNTER — Emergency Department: Payer: Self-pay

## 2016-01-23 ENCOUNTER — Encounter: Payer: Self-pay | Admitting: Emergency Medicine

## 2016-01-23 ENCOUNTER — Emergency Department
Admission: EM | Admit: 2016-01-23 | Discharge: 2016-01-24 | Disposition: A | Discharge status: Home / Self Care | Payer: Self-pay | Attending: Emergency Medicine | Admitting: Emergency Medicine

## 2016-01-23 DIAGNOSIS — O99331 Smoking (tobacco) complicating pregnancy, first trimester: Secondary | ICD-10-CM | POA: Insufficient documentation

## 2016-01-23 DIAGNOSIS — R1030 Lower abdominal pain, unspecified: Secondary | ICD-10-CM

## 2016-01-23 DIAGNOSIS — R1110 Vomiting, unspecified: Secondary | ICD-10-CM

## 2016-01-23 DIAGNOSIS — R197 Diarrhea, unspecified: Secondary | ICD-10-CM | POA: Insufficient documentation

## 2016-01-23 DIAGNOSIS — F17200 Nicotine dependence, unspecified, uncomplicated: Secondary | ICD-10-CM | POA: Insufficient documentation

## 2016-01-23 DIAGNOSIS — Z3A13 13 weeks gestation of pregnancy: Secondary | ICD-10-CM | POA: Insufficient documentation

## 2016-01-23 DIAGNOSIS — O26891 Other specified pregnancy related conditions, first trimester: Secondary | ICD-10-CM | POA: Insufficient documentation

## 2016-01-23 DIAGNOSIS — O219 Vomiting of pregnancy, unspecified: Secondary | ICD-10-CM | POA: Insufficient documentation

## 2016-01-23 LAB — HCG, QUANTITATIVE, PREGNANCY: HCG, BETA CHAIN, QUANT, S: 58403 m[IU]/mL — AB (ref <5)

## 2016-01-23 LAB — CBC
HEMATOCRIT: 38.6 % (ref 35.0–47.0)
Hemoglobin: 13.4 g/dL (ref 12.0–16.0)
MCH: 30 pg (ref 26.0–34.0)
MCHC: 34.7 g/dL (ref 32.0–36.0)
MCV: 86.5 fL (ref 80.0–100.0)
Platelets: 271 10*3/uL (ref 150–440)
RBC: 4.47 MIL/uL (ref 3.80–5.20)
RDW: 13.3 % (ref 11.5–14.5)
WBC: 10.7 10*3/uL (ref 3.6–11.0)

## 2016-01-23 LAB — URINALYSIS, COMPLETE (UACMP) WITH MICROSCOPIC
BACTERIA UA: NONE SEEN
BILIRUBIN URINE: NEGATIVE
GLUCOSE, UA: NEGATIVE mg/dL
HGB URINE DIPSTICK: NEGATIVE
Ketones, ur: 5 mg/dL — AB
LEUKOCYTES UA: NEGATIVE
NITRITE: NEGATIVE
PH: 5 (ref 5.0–8.0)
Protein, ur: NEGATIVE mg/dL
SPECIFIC GRAVITY, URINE: 1.023 (ref 1.005–1.030)

## 2016-01-23 LAB — COMPREHENSIVE METABOLIC PANEL
ALK PHOS: 53 U/L (ref 38–126)
ALT: 11 U/L — ABNORMAL LOW (ref 14–54)
ANION GAP: 7 (ref 5–15)
AST: 14 U/L — ABNORMAL LOW (ref 15–41)
Albumin: 3.7 g/dL (ref 3.5–5.0)
BILIRUBIN TOTAL: 0.1 mg/dL — AB (ref 0.3–1.2)
BUN: 6 mg/dL (ref 6–20)
CALCIUM: 8.9 mg/dL (ref 8.9–10.3)
CO2: 23 mmol/L (ref 22–32)
Chloride: 104 mmol/L (ref 101–111)
Creatinine, Ser: 0.45 mg/dL (ref 0.44–1.00)
GFR calc Af Amer: >60 mL/min (ref >60)
GLUCOSE: 87 mg/dL (ref 65–99)
POTASSIUM: 3.5 mmol/L (ref 3.5–5.1)
Sodium: 134 mmol/L — ABNORMAL LOW (ref 135–145)
TOTAL PROTEIN: 7 g/dL (ref 6.5–8.1)

## 2016-01-23 LAB — LIPASE, BLOOD: Lipase: 14 U/L (ref 11–51)

## 2016-01-23 MED ORDER — SODIUM CHLORIDE 0.9 % IV BOLUS (SEPSIS)
1000.0000 mL | Freq: Once | INTRAVENOUS | Status: AC
  Administered 2016-01-23: 1000 mL via INTRAVENOUS

## 2016-01-23 MED ORDER — METOCLOPRAMIDE HCL 5 MG/ML IJ SOLN
10.0000 mg | Freq: Once | INTRAMUSCULAR | Status: AC
Start: 2016-01-23 — End: 2016-01-23
  Administered 2016-01-23: 10 mg via INTRAVENOUS

## 2016-01-23 MED ORDER — METOCLOPRAMIDE HCL 5 MG/ML IJ SOLN
INTRAMUSCULAR | Status: AC
  Administered 2016-01-23: 10 mg via INTRAVENOUS
  Filled 2016-01-23: qty 2

## 2016-01-23 MED ORDER — PROMETHAZINE HCL 12.5 MG PO TABS: 12.5000 mg | ORAL_TABLET | Freq: Four times a day (QID) | ORAL | 0 refills | Status: DC | PRN

## 2016-01-23 NOTE — ED Notes (Signed)
Unable to assess fetal heart tone by this RN and Misty StanleyLisa, Charity fundraiserN.

## 2016-01-23 NOTE — ED Triage Notes (Signed)
Pt presents to ED with c/o nausea, vomiting, and diarrhea since 23:00 last night. Pt also c/o abd cramping tonight accompanied by lightheadedness. Pt states she is [redacted] weeks pregnant. Pt denies vaginal bleeding.

## 2016-01-23 NOTE — ED Provider Notes (Signed)
[facility] Emergency Department Provider Note   First MD Initiated Contact with Patient 01/23/16 2143     (approximate)  I have reviewed the triage vital signs and the nursing notes.   HISTORY  Chief Complaint Emesis   HPI Gina Foster is a 31 y.o. female presents with history of approximately [redacted] weeks pregnant presents to the emergency department with acute onset of vomiting and diarrhea which started 11:00 PM last night. Patient is a sick contact with same symptoms. Patient denies any fever or febrile on presentation. Of note patient has had no prenatal care.   Past Medical History:  Diagnosis Date  . GERD (gastroesophageal reflux disease)     There are no active problems to display for this patient.   Past Surgical History:  Procedure Laterality Date  . ABDOMINAL SURGERY    . CESAREAN SECTION    . CHOLECYSTECTOMY      Prior to Admission medications   Medication Sig Start Date End Date Taking? Authorizing Provider  cephALEXin (KEFLEX) 500 MG capsule Take 1 capsule (500 mg total) by mouth 4 (four) times daily. 06/27/15   Delorise RoyalsJonathan D Cuthriell, PA-C  Doxylamine-Pyridoxine 10-10 MG TBEC Take 1 tablet by mouth every 12 (twelve) hours as needed. 12/02/15   Willy EddyPatrick Robinson, MD  HYDROcodone-acetaminophen (NORCO/VICODIN) 5-325 MG tablet Take 1 tablet by mouth every 4 (four) hours as needed for moderate pain. 06/27/15   Delorise RoyalsJonathan D Cuthriell, PA-C  promethazine (PHENERGAN) 12.5 MG tablet Take 1 tablet (12.5 mg total) by mouth every 6 (six) hours as needed for nausea or vomiting. 01/23/16   Darci Currentandolph N Grayling Schranz, MD  ranitidine (ZANTAC) 150 MG capsule Take 1 capsule (150 mg total) by mouth 2 (two) times daily. Patient not taking: Reported on 06/29/2015 09/11/14   Sharman CheekPhillip Stafford, MD  sucralfate (CARAFATE) 1 G tablet Take 1 tablet (1 g total) by mouth 4 (four) times daily. Patient not taking: Reported on 06/29/2015 09/11/14   Sharman CheekPhillip Stafford, MD    sulfamethoxazole-trimethoprim (BACTRIM DS,SEPTRA DS) 800-160 MG tablet Take 2 tablets by mouth 2 (two) times daily. 06/29/15   Myrna Blazeravid Matthew Schaevitz, MD    Allergies Morphine and related  No family history on file.  Social History Social History  Substance Use Topics  . Smoking status: Current Every Day Smoker  . Smokeless tobacco: Never Used     Comment: pt states smoke 2 cig/day  . Alcohol use No    Review of Systems Constitutional: No fever/chills Eyes: No visual changes. ENT: No sore throat. Cardiovascular: Denies chest pain. Respiratory: Denies shortness of breath. Gastrointestinal: Positive for vomiting and diarrhea Genitourinary: Negative for dysuria. Musculoskeletal: Negative for back pain. Skin: Negative for rash. Neurological: Negative for headaches, focal weakness or numbness.  10-point ROS otherwise negative.  ____________________________________________   PHYSICAL EXAM:  VITAL SIGNS: ED Triage Vitals  Enc Vitals Group     BP 01/23/16 2056 117/70     Pulse Rate 01/23/16 2056 72     Resp 01/23/16 2056 18     Temp 01/23/16 2056 98.6 F (37 C)     Temp Source 01/23/16 2056 Oral     SpO2 01/23/16 2056 100 %     Weight 01/23/16 2056 220 lb (99.8 kg)     Height 01/23/16 2056 5\' 9"  (1.753 m)     Head Circumference --      Peak Flow --      Pain Score 01/23/16 2104 7     Pain Loc --  Pain Edu? --      Excl. in GC? --     Constitutional: Alert and oriented. Well appearing and in no acute distress. Eyes: Conjunctivae are normal. PERRL. EOMI. Head: Atraumatic. Mouth/Throat: Mucous membranes are moist.  Oropharynx non-erythematous. Neck: No stridor.  Cardiovascular: Normal rate, regular rhythm. Good peripheral circulation. Grossly normal heart sounds. Respiratory: Normal respiratory effort.  No retractions. Lungs CTAB. Gastrointestinal: Soft and nontender. No distention.  Musculoskeletal: No lower extremity tenderness nor edema. No gross  deformities of extremities. Neurologic:  Normal speech and language. No gross focal neurologic deficits are appreciated.  Skin:  Skin is warm, dry and intact. No rash noted. Psychiatric: Mood and affect are normal. Speech and behavior are normal.  ____________________________________________   LABS (all labs ordered are listed, but only abnormal results are displayed)  Labs Reviewed  COMPREHENSIVE METABOLIC PANEL - Abnormal; Notable for the following:       Result Value   Sodium 134 (*)    AST 14 (*)    ALT 11 (*)    Total Bilirubin 0.1 (*)    All other components within normal limits  URINALYSIS, COMPLETE (UACMP) WITH MICROSCOPIC - Abnormal; Notable for the following:    Color, Urine YELLOW (*)    APPearance CLEAR (*)    Ketones, ur 5 (*)    Squamous Epithelial / LPF 0-5 (*)    All other components within normal limits  HCG, QUANTITATIVE, PREGNANCY - Abnormal; Notable for the following:    hCG, Beta Chain, Quant, S 58,403 (*)    All other components within normal limits  LIPASE, BLOOD  CBC    RADIOLOGY I, [place], personally viewed and evaluated these images (plain radiographs) as part of my medical decision making, as well as reviewing the written report by the radiologist.  US Ob Comp Less 14 Wks  Result Date: 01/23/2016 CLINICAL DATA:  Lower abdominal pain for 1 day EXAM: OBSTETRIC <14 WK ULTRASOUND TECHNIQUE: Transabdominal ultrasound was performed for evaluation of the gestation as well as the maternal uterus and adnexal regions. COMPARISON:  12/02/2015 FINDINGS: Intrauterine gestational sac: Single intrauterine gestation Yolk sac:  Not visualized Embryo:  Visualized Cardiac Activity: Visualized Heart Rate: 165 bpm CRL:   7  mm   13 w 2 d                  [place] EDC: 07/28/2016 Subchorionic hemorrhage:  None visualized. Maternal uterus/adnexae: Bilateral ovaries are within normal limits. Right ovary measures 3.4 x 2.1 x 2.3 cm. Left ovary measures 2.3 x 1.8 x 1.8 cm. No  free fluid. IMPRESSION: Single viable intrauterine gestation as above. Otherwise negative examination. Electronically Signed   By: Jasmine Pang M.D.   On: 01/23/2016 23:08     Procedures    INITIAL IMPRESSION / ASSESSMENT AND PLAN / ED COURSE  Pertinent labs & imaging results that were available during my care of the patient were reviewed by me and considered in my medical decision making (see chart for details).  Strip physical exam consistent with gastroenteritis such patient received Reglan with no further vomiting emergency department. In addition the patient received 2 L IV normal saline. Patient will be prescribed Phenergan for home. Patient has no abdominal pain on exam. As such no imaging was performed.   Clinical Course     ____________________________________________  FINAL CLINICAL IMPRESSION(S) / ED DIAGNOSES  Final diagnoses:  Vomiting and diarrhea     MEDICATIONS GIVEN DURING THIS VISIT:  Medications  metoCLOPramide (REGLAN) injection 10 mg (10 mg Intravenous Given 01/23/16 2221)  sodium chloride 0.9 % bolus 1,000 mL (0 mLs Intravenous Stopped 01/23/16 2338)  sodium chloride 0.9 % bolus 1,000 mL (0 mLs Intravenous Stopped 01/23/16 2338)     NEW OUTPATIENT MEDICATIONS STARTED DURING THIS VISIT:  New Prescriptions   PROMETHAZINE (PHENERGAN) 12.5 MG TABLET    Take 1 tablet (12.5 mg total) by mouth every 6 (six) hours as needed for nausea or vomiting.    Modified Medications   No medications on file    Discontinued Medications   No medications on file     Note:  This document was prepared using Dragon voice recognition software and may include unintentional dictation errors.    Darci Current, MD 01/23/16 [phone]

## 2016-06-29 ENCOUNTER — Encounter: Payer: Self-pay | Admitting: *Deleted

## 2016-06-29 ENCOUNTER — Inpatient Hospital Stay
Admission: EM | Admit: 2016-06-29 | Discharge: 2016-06-29 | Disposition: A | Discharge status: Home / Self Care | Payer: Medicaid Other | Attending: Obstetrics and Gynecology | Admitting: Obstetrics and Gynecology

## 2016-06-29 DIAGNOSIS — O368130 Decreased fetal movements, third trimester, not applicable or unspecified: Secondary | ICD-10-CM | POA: Diagnosis present

## 2016-06-29 DIAGNOSIS — O26893 Other specified pregnancy related conditions, third trimester: Secondary | ICD-10-CM | POA: Insufficient documentation

## 2016-06-29 DIAGNOSIS — M545 Low back pain: Secondary | ICD-10-CM | POA: Insufficient documentation

## 2016-06-29 DIAGNOSIS — Z3A39 39 weeks gestation of pregnancy: Secondary | ICD-10-CM | POA: Diagnosis not present

## 2016-06-29 DIAGNOSIS — O09293 Supervision of pregnancy with other poor reproductive or obstetric history, third trimester: Secondary | ICD-10-CM

## 2016-06-29 LAB — WET PREP, GENITAL
Clue Cells Wet Prep HPF POC: NONE SEEN
SPERM: NONE SEEN
Trich, Wet Prep: NONE SEEN
Yeast Wet Prep HPF POC: NONE SEEN

## 2016-06-29 LAB — URINALYSIS, ROUTINE W REFLEX MICROSCOPIC
Bilirubin Urine: NEGATIVE
Glucose, UA: NEGATIVE mg/dL
HGB URINE DIPSTICK: NEGATIVE
Ketones, ur: NEGATIVE mg/dL
Leukocytes, UA: NEGATIVE
Nitrite: NEGATIVE
PH: 6 (ref 5.0–8.0)
Protein, ur: NEGATIVE mg/dL
SPECIFIC GRAVITY, URINE: 1.013 (ref 1.005–1.030)

## 2016-06-29 MED ORDER — ACETAMINOPHEN 325 MG PO TABS: 650.0000 mg | ORAL_TABLET | ORAL | Status: DC | PRN

## 2016-06-29 MED ORDER — CALCIUM CARBONATE ANTACID 500 MG PO CHEW: 2.0000 | CHEWABLE_TABLET | ORAL | Status: DC | PRN

## 2016-06-29 NOTE — OB Triage Note (Signed)
Patient to OBS1 for complaint of lower back spasms that come around to both hips.  She also complained of decreased fetal movement.

## 2016-06-29 NOTE — OB Triage Note (Signed)
Back pain started "last week."  1600 today back pain became worse. Also c/o decreased fetal movement. Elaina HoopsElks, Lashaya Kienitz S

## 2016-06-29 NOTE — Discharge Summary (Signed)
Triage Visit with NST    Gina Foster is a 31 y.o. G2P1001 at 39+1wks with a due date of 07/05/16.  Indication: Decreased fetal movement and lower back pain that started yesterday. No dysuria. No vaginal discharge or odor. No intermittent contractions. Similar pain previously in pregnancy.  S: Resting comfortably. no CTX, no VB.  - Patient is now feeling baby move well.  - Back pain is constant  :  BP 112/74 (BP Location: Left Arm)   Pulse 100   Temp 98.3 F (36.8 C) (Oral)   Resp 18   Ht 5\' 9"  (1.753 m)   Wt 102.1 kg (225 lb)   LMP 10/11/2015   BMI 33.23 kg/m  Results for orders placed or performed during the hospital encounter of 06/29/16 (from the past 48 hour(s))  Wet prep, genital   Collection Time: 06/29/16 12:48 PM  Result Value Ref Range   Yeast Wet Prep HPF POC NONE SEEN NONE SEEN   Trich, Wet Prep NONE SEEN NONE SEEN   Clue Cells Wet Prep HPF POC NONE SEEN NONE SEEN   WBC, Wet Prep HPF POC FEW (A) NONE SEEN   Sperm NONE SEEN   Urinalysis, Routine w reflex microscopic   Collection Time: 06/29/16 12:48 PM  Result Value Ref Range   Color, Urine YELLOW (A) YELLOW   APPearance HAZY (A) CLEAR   Specific Gravity, Urine 1.013 1.005 - 1.030   pH 6.0 5.0 - 8.0   Glucose, UA NEGATIVE NEGATIVE mg/dL   Hgb urine dipstick NEGATIVE NEGATIVE   Bilirubin Urine NEGATIVE NEGATIVE   Ketones, ur NEGATIVE NEGATIVE mg/dL   Protein, ur NEGATIVE NEGATIVE mg/dL   Nitrite NEGATIVE NEGATIVE   Leukocytes, UA NEGATIVE NEGATIVE     Gen: NAD, AAOx3      Abd: FNTTP      Ext: Non-tender, Nonedmeatous    FHT: moderate variability, +accels, no decels TOCO: quiet SVE: deferred   A/P:  31 y.o. G2P1001 with concerns for decreased fetal movement, now resolved.    Labor: not present.   No s/s infection: Wet mount negative. No evidence of UTI. Conservative measures recommended.  Prior hx of oligohydramnios  Fetal Wellbeing: NST is Reassuring reactive tracing   D/c home stable,  precautions reviewed, follow-up as scheduled.

## 2016-07-10 NOTE — H&P (Signed)
Gina Foster is a 31 y.o. female presenting for elective repeat LTCS+ BTL . EDC 07/28/16 based on 13+2 wk u/s OB History    Gravida Para Term Preterm AB Living   2 1 1     1    SAB TAB Ectopic Multiple Live Births           1     Past Medical History:  Diagnosis Date  . GERD (gastroesophageal reflux disease)    Past Surgical History:  Procedure Laterality Date  . ABDOMINAL SURGERY    . CESAREAN SECTION    . CHOLECYSTECTOMY     Family History: family history is not on file. Social History:  reports that she has been smoking.  She has been smoking about 0.25 packs per day. She has never used smokeless tobacco. She reports that she does not drink alcohol or use drugs.     Maternal Diabetes: No Genetic Screening: Declined, late entry  Maternal Ultrasounds/Referrals: Normal Fetal Ultrasounds or other Referrals:  None Maternal Substance Abuse:  No Significant Maternal Medications:  None Significant Maternal Lab Results:  None Other Comments:  None  ROS History   Last menstrual period 10/11/2015. Exam   Lungs CTA  CV RRR  abd soft NT  Gravid   Physical Exam  Prenatal labs: ABO, Rh:  b+ Antibody:  neg  Rubella:  Imm , Varicella NON IMMUNE RPR:   neg HBsAg:   neg HIV:   neg  GBS:   Positive   Assessment/Plan: Elective repeat C/S and BTL ( medicaid signed 06/27/16)  Risk of the procedure d/w the pt . Failure rate from BTL is 1:200/ yr Suzy Bouchardhomas J Dathan Attia 07/10/2016, 4:04 PM

## 2016-07-25 ENCOUNTER — Observation Stay
Admission: EM | Admit: 2016-07-25 | Discharge: 2016-07-26 | Disposition: A | Discharge status: Home / Self Care | Payer: Medicaid Other | Attending: Obstetrics and Gynecology | Admitting: Obstetrics and Gynecology

## 2016-07-25 DIAGNOSIS — Z9889 Other specified postprocedural states: Secondary | ICD-10-CM | POA: Insufficient documentation

## 2016-07-25 DIAGNOSIS — Z3490 Encounter for supervision of normal pregnancy, unspecified, unspecified trimester: Secondary | ICD-10-CM

## 2016-07-25 DIAGNOSIS — Z043 Encounter for examination and observation following other accident: Secondary | ICD-10-CM | POA: Diagnosis not present

## 2016-07-25 DIAGNOSIS — O99333 Smoking (tobacco) complicating pregnancy, third trimester: Secondary | ICD-10-CM | POA: Diagnosis not present

## 2016-07-25 DIAGNOSIS — Z3A40 40 weeks gestation of pregnancy: Secondary | ICD-10-CM | POA: Insufficient documentation

## 2016-07-25 DIAGNOSIS — K219 Gastro-esophageal reflux disease without esophagitis: Secondary | ICD-10-CM | POA: Diagnosis not present

## 2016-07-25 DIAGNOSIS — Z888 Allergy status to other drugs, medicaments and biological substances status: Secondary | ICD-10-CM | POA: Diagnosis not present

## 2016-07-25 DIAGNOSIS — Z809 Family history of malignant neoplasm, unspecified: Secondary | ICD-10-CM | POA: Diagnosis not present

## 2016-07-25 DIAGNOSIS — O34219 Maternal care for unspecified type scar from previous cesarean delivery: Secondary | ICD-10-CM | POA: Diagnosis not present

## 2016-07-25 DIAGNOSIS — Z8489 Family history of other specified conditions: Secondary | ICD-10-CM | POA: Insufficient documentation

## 2016-07-25 DIAGNOSIS — O99613 Diseases of the digestive system complicating pregnancy, third trimester: Secondary | ICD-10-CM | POA: Diagnosis not present

## 2016-07-25 DIAGNOSIS — O24313 Unspecified pre-existing diabetes mellitus in pregnancy, third trimester: Secondary | ICD-10-CM | POA: Diagnosis not present

## 2016-07-25 DIAGNOSIS — E119 Type 2 diabetes mellitus without complications: Secondary | ICD-10-CM | POA: Insufficient documentation

## 2016-07-25 DIAGNOSIS — Z833 Family history of diabetes mellitus: Secondary | ICD-10-CM | POA: Diagnosis not present

## 2016-07-25 MED ORDER — CYCLOBENZAPRINE HCL 10 MG PO TABS
10.0000 mg | ORAL_TABLET | Freq: Once | ORAL | Status: AC | PRN
  Administered 2016-07-25: 10 mg via ORAL
  Filled 2016-07-25: qty 1

## 2016-07-25 NOTE — OB Triage Note (Signed)
2130 Dr. Dalbert GarnetBeasley notified of pt being on the unit report given.  New orders given

## 2016-07-26 DIAGNOSIS — Z043 Encounter for examination and observation following other accident: Secondary | ICD-10-CM | POA: Diagnosis not present

## 2016-07-28 ENCOUNTER — Encounter
Admission: RE | Admit: 2016-07-28 | Discharge: 2016-07-28 | Disposition: A | Discharge status: Home / Self Care | Payer: Medicaid Other | Source: Ambulatory Visit | Attending: Obstetrics and Gynecology | Admitting: Obstetrics and Gynecology

## 2016-07-28 HISTORY — DX: Type 2 diabetes mellitus without complications: E11.9

## 2016-07-28 LAB — CBC
HEMATOCRIT: 36.6 % (ref 35.0–47.0)
HEMOGLOBIN: 12.7 g/dL (ref 12.0–16.0)
MCH: 29.5 pg (ref 26.0–34.0)
MCHC: 34.6 g/dL (ref 32.0–36.0)
MCV: 85.1 fL (ref 80.0–100.0)
Platelets: 270 10*3/uL (ref 150–440)
RBC: 4.31 MIL/uL (ref 3.80–5.20)
RDW: 14.5 % (ref 11.5–14.5)
WBC: 14.2 10*3/uL — ABNORMAL HIGH (ref 3.6–11.0)

## 2016-07-28 NOTE — Patient Instructions (Addendum)
Your procedure is scheduled on: 07/29/16 Report to THE BIRTHPLACE ON THE 3RD FLOOR AT 5:45 AM, [facility](ENTER THRU EMERGENCY DEPT.)  .  Remember: Instructions that are not followed completely may result in serious medical risk, up to and including death, or upon the discretion of your surgeon and anesthesiologist your surgery may need to be rescheduled.    __X__ 1. Do not eat food or drink liquids after midnight. No gum chewing or hard candies.     __X__ 2. No Alcohol for 24 hours before or after surgery.   ____ 3. Bring all medications with you on the day of surgery if instructed.    __X__ 4. Notify your doctor if there is any change in your medical condition     (cold, fever, infections).             __X___5. No smoking within 24 hours of your surgery.     Do not wear jewelry, make-up, hairpins, clips or nail polish.  Do not wear lotions, powders, or perfumes.   Do not shave 48 hours prior to surgery. Men may shave face and neck.  Do not bring valuables to the hospital.    [facility] is not responsible for any belongings or valuables.               Contacts, dentures or bridgework may not be worn into surgery.  Leave your suitcase in the car. After surgery it may be brought to your room.  For patients admitted to the hospital, discharge time is determined by your                treatment team.   Patients discharged the day of surgery will not be allowed to drive home.   Please read over the following fact sheets that you were given:   MRSA Information   __X__ Take these medicines the morning of surgery with A SIP OF WATER:    1. RANITIDINE  2.   3.   4.  5.  6.  ____ Fleet Enema (as directed)   __x__ Use CHG Soap as directed  ____ Use inhalers on the day of surgery  ____ Stop metformin 2 days prior to surgery    ____ Take 1/2 of usual insulin dose the night before surgery and none on the morning of surgery.   ____ Stop Coumadin/Plavix/aspirin on   ____ Stop  Anti-inflammatories such as Advil, Aleve, Ibuprofen, Motrin, Naproxen, Naprosyn, Goodies,powder, or aspirin products.  OK to take Tylenol.   ____ Stop supplements until after surgery.    ____ Bring C-Pap to the hospital.

## 2016-07-29 ENCOUNTER — Inpatient Hospital Stay: Payer: Medicaid Other | Admitting: Registered Nurse

## 2016-07-29 ENCOUNTER — Encounter
Admission: RE | Disposition: A | Discharge status: Home / Self Care | Payer: Self-pay | Source: Ambulatory Visit | Attending: Obstetrics and Gynecology

## 2016-07-29 ENCOUNTER — Inpatient Hospital Stay
Admission: RE | Admit: 2016-07-29 | Discharge: 2016-08-01 | DRG: 765 | Disposition: A | Discharge status: Home / Self Care | Payer: Medicaid Other | Source: Ambulatory Visit | Attending: Obstetrics and Gynecology | Admitting: Obstetrics and Gynecology

## 2016-07-29 ENCOUNTER — Encounter: Payer: Self-pay | Admitting: Obstetrics and Gynecology

## 2016-07-29 ENCOUNTER — Emergency Department
Admission: EM | Admit: 2016-07-29 | Discharge: 2016-07-29 | Disposition: A | Discharge status: Still In Hospital | Payer: Medicaid Other

## 2016-07-29 DIAGNOSIS — O34211 Maternal care for low transverse scar from previous cesarean delivery: Principal | ICD-10-CM | POA: Diagnosis present

## 2016-07-29 DIAGNOSIS — O99334 Smoking (tobacco) complicating childbirth: Secondary | ICD-10-CM | POA: Diagnosis present

## 2016-07-29 DIAGNOSIS — Z3A40 40 weeks gestation of pregnancy: Secondary | ICD-10-CM

## 2016-07-29 DIAGNOSIS — F17210 Nicotine dependence, cigarettes, uncomplicated: Secondary | ICD-10-CM | POA: Diagnosis present

## 2016-07-29 DIAGNOSIS — O9962 Diseases of the digestive system complicating childbirth: Secondary | ICD-10-CM | POA: Diagnosis present

## 2016-07-29 DIAGNOSIS — Z9889 Other specified postprocedural states: Secondary | ICD-10-CM

## 2016-07-29 DIAGNOSIS — D62 Acute posthemorrhagic anemia: Secondary | ICD-10-CM | POA: Diagnosis not present

## 2016-07-29 DIAGNOSIS — O9081 Anemia of the puerperium: Secondary | ICD-10-CM | POA: Diagnosis not present

## 2016-07-29 DIAGNOSIS — Z302 Encounter for sterilization: Secondary | ICD-10-CM

## 2016-07-29 DIAGNOSIS — K219 Gastro-esophageal reflux disease without esophagitis: Secondary | ICD-10-CM | POA: Diagnosis present

## 2016-07-29 DIAGNOSIS — O99824 Streptococcus B carrier state complicating childbirth: Secondary | ICD-10-CM | POA: Diagnosis present

## 2016-07-29 LAB — GLUCOSE, CAPILLARY: GLUCOSE-CAPILLARY: 98 mg/dL (ref 65–99)

## 2016-07-29 LAB — ABO/RH: ABO/RH(D): B POS

## 2016-07-29 SURGERY — Surgical Case
Anesthesia: Spinal | Laterality: Bilateral

## 2016-07-29 MED ORDER — ONDANSETRON HCL 4 MG/2ML IJ SOLN
INTRAMUSCULAR | Status: AC
  Filled 2016-07-29: qty 2

## 2016-07-29 MED ORDER — BUPIVACAINE HCL (PF) 0.5 % IJ SOLN
INTRAMUSCULAR | Status: AC
  Filled 2016-07-29: qty 30

## 2016-07-29 MED ORDER — OXYTOCIN 40 UNITS IN LACTATED RINGERS INFUSION - SIMPLE MED
2.5000 [IU]/h | INTRAVENOUS | Status: DC
  Administered 2016-07-29: 2.5 [IU]/h via INTRAVENOUS

## 2016-07-29 MED ORDER — LACTATED RINGERS IV SOLN
INTRAVENOUS | Status: DC
  Administered 2016-07-29: 07:00:00 via INTRAVENOUS

## 2016-07-29 MED ORDER — DIPHENHYDRAMINE HCL 25 MG PO CAPS: 25.0000 mg | ORAL_CAPSULE | Freq: Four times a day (QID) | ORAL | Status: DC | PRN

## 2016-07-29 MED ORDER — BUPIVACAINE LIPOSOME 1.3 % IJ SUSP
INTRAMUSCULAR | Status: AC
  Filled 2016-07-29: qty 20

## 2016-07-29 MED ORDER — KETOROLAC TROMETHAMINE 30 MG/ML IJ SOLN
INTRAMUSCULAR | Status: DC | PRN
  Administered 2016-07-29: 30 mg via INTRAVENOUS

## 2016-07-29 MED ORDER — DIBUCAINE 1 % RE OINT: 1.0000 "application " | TOPICAL_OINTMENT | RECTAL | Status: DC | PRN

## 2016-07-29 MED ORDER — BUPIVACAINE HCL (PF) 0.5 % IJ SOLN
INTRAMUSCULAR | Status: DC | PRN
  Administered 2016-07-29: 20 mL

## 2016-07-29 MED ORDER — MENTHOL 3 MG MT LOZG
1.0000 | LOZENGE | OROMUCOSAL | Status: DC | PRN
  Filled 2016-07-29: qty 9

## 2016-07-29 MED ORDER — MEPERIDINE HCL 50 MG/ML IJ SOLN
50.0000 mg | INTRAMUSCULAR | Status: DC | PRN
  Administered 2016-07-29 (×4): 50 mg via INTRAVENOUS
  Administered 2016-07-30: 25 mg via INTRAVENOUS
  Administered 2016-07-30: 50 mg via INTRAVENOUS
  Filled 2016-07-29 (×7): qty 1

## 2016-07-29 MED ORDER — LACTATED RINGERS IV SOLN
INTRAVENOUS | Status: DC
  Administered 2016-07-29 – 2016-07-30 (×3): via INTRAVENOUS

## 2016-07-29 MED ORDER — KETOROLAC TROMETHAMINE 30 MG/ML IJ SOLN
INTRAMUSCULAR | Status: AC
  Filled 2016-07-29: qty 1

## 2016-07-29 MED ORDER — SODIUM CHLORIDE 0.9 % IV SOLN
INTRAVENOUS | Status: DC | PRN
  Administered 2016-07-29: 50 mL

## 2016-07-29 MED ORDER — OXYTOCIN 40 UNITS IN LACTATED RINGERS INFUSION - SIMPLE MED
INTRAVENOUS | Status: AC
  Filled 2016-07-29: qty 1000

## 2016-07-29 MED ORDER — LIDOCAINE HCL (PF) 2 % IJ SOLN
INTRAMUSCULAR | Status: AC
  Filled 2016-07-29: qty 2

## 2016-07-29 MED ORDER — SENNOSIDES-DOCUSATE SODIUM 8.6-50 MG PO TABS
2.0000 | ORAL_TABLET | ORAL | Status: DC
  Administered 2016-07-29 – 2016-07-31 (×3): 2 via ORAL
  Filled 2016-07-29 (×3): qty 2

## 2016-07-29 MED ORDER — PROPOFOL 10 MG/ML IV BOLUS
INTRAVENOUS | Status: AC
  Filled 2016-07-29: qty 20

## 2016-07-29 MED ORDER — SIMETHICONE 80 MG PO CHEW: 80.0000 mg | CHEWABLE_TABLET | ORAL | Status: DC | PRN

## 2016-07-29 MED ORDER — COCONUT OIL OIL: 1.0000 "application " | TOPICAL_OIL | Status: DC | PRN

## 2016-07-29 MED ORDER — FENTANYL CITRATE (PF) 100 MCG/2ML IJ SOLN: 25.0000 ug | INTRAMUSCULAR | Status: DC | PRN

## 2016-07-29 MED ORDER — LACTATED RINGERS IV SOLN
INTRAVENOUS | Status: DC
  Administered 2016-07-29: 999 mL/h via INTRAVENOUS

## 2016-07-29 MED ORDER — ONDANSETRON HCL 4 MG/2ML IJ SOLN
INTRAMUSCULAR | Status: DC | PRN
  Administered 2016-07-29: 4 mg via INTRAVENOUS

## 2016-07-29 MED ORDER — SODIUM CHLORIDE 0.9 % IV SOLN
INTRAVENOUS | Status: DC | PRN
  Administered 2016-07-29: 30 ug/min via INTRAVENOUS

## 2016-07-29 MED ORDER — WITCH HAZEL-GLYCERIN EX PADS: 1.0000 "application " | MEDICATED_PAD | CUTANEOUS | Status: DC | PRN

## 2016-07-29 MED ORDER — OXYCODONE-ACETAMINOPHEN 5-325 MG PO TABS
2.0000 | ORAL_TABLET | ORAL | Status: DC | PRN
  Administered 2016-07-29 – 2016-07-31 (×7): 2 via ORAL
  Filled 2016-07-29 (×7): qty 2

## 2016-07-29 MED ORDER — CEFAZOLIN SODIUM-DEXTROSE 2-4 GM/100ML-% IV SOLN
2.0000 g | INTRAVENOUS | Status: AC
  Administered 2016-07-29: 2 g via INTRAVENOUS
  Filled 2016-07-29: qty 100

## 2016-07-29 MED ORDER — EPHEDRINE SULFATE-NACL 50-0.9 MG/10ML-% IV SOSY
PREFILLED_SYRINGE | INTRAVENOUS | Status: DC | PRN
  Administered 2016-07-29: 5 mg via INTRAVENOUS

## 2016-07-29 MED ORDER — SOD CITRATE-CITRIC ACID 500-334 MG/5ML PO SOLN
ORAL | Status: AC
  Filled 2016-07-29: qty 15

## 2016-07-29 MED ORDER — OXYTOCIN 40 UNITS IN LACTATED RINGERS INFUSION - SIMPLE MED
INTRAVENOUS | Status: DC | PRN
  Administered 2016-07-29: 1000 mL via INTRAVENOUS

## 2016-07-29 MED ORDER — PRENATAL MULTIVITAMIN CH
1.0000 | ORAL_TABLET | Freq: Every day | ORAL | Status: DC
  Administered 2016-07-31 – 2016-08-01 (×3): 1 via ORAL
  Filled 2016-07-29 (×3): qty 1

## 2016-07-29 MED ORDER — SIMETHICONE 80 MG PO CHEW
80.0000 mg | CHEWABLE_TABLET | Freq: Three times a day (TID) | ORAL | Status: DC
  Administered 2016-07-29 – 2016-08-01 (×9): 80 mg via ORAL
  Filled 2016-07-29 (×9): qty 1

## 2016-07-29 MED ORDER — IBUPROFEN 600 MG PO TABS: 600.0000 mg | ORAL_TABLET | Freq: Four times a day (QID) | ORAL | Status: DC | PRN

## 2016-07-29 MED ORDER — ONDANSETRON HCL 4 MG/2ML IJ SOLN
4.0000 mg | INTRAMUSCULAR | Status: DC | PRN
  Administered 2016-07-29: 4 mg via INTRAVENOUS

## 2016-07-29 MED ORDER — KETOROLAC TROMETHAMINE 30 MG/ML IJ SOLN
30.0000 mg | Freq: Three times a day (TID) | INTRAMUSCULAR | Status: DC | PRN
  Administered 2016-07-29 – 2016-07-30 (×3): 30 mg via INTRAVENOUS
  Filled 2016-07-29 (×3): qty 1

## 2016-07-29 MED ORDER — FENTANYL CITRATE (PF) 100 MCG/2ML IJ SOLN
INTRAMUSCULAR | Status: DC | PRN
  Administered 2016-07-29: 15 ug via INTRATHECAL

## 2016-07-29 MED ORDER — BUPIVACAINE IN DEXTROSE 0.75-8.25 % IT SOLN
INTRATHECAL | Status: DC | PRN
  Administered 2016-07-29: 1.8 mL via INTRATHECAL

## 2016-07-29 MED ORDER — TETANUS-DIPHTH-ACELL PERTUSSIS 5-2.5-18.5 LF-MCG/0.5 IM SUSP: 0.5000 mL | Freq: Once | INTRAMUSCULAR | Status: DC

## 2016-07-29 MED ORDER — OXYCODONE-ACETAMINOPHEN 5-325 MG PO TABS: 1.0000 | ORAL_TABLET | ORAL | Status: DC | PRN

## 2016-07-29 MED ORDER — SOD CITRATE-CITRIC ACID 500-334 MG/5ML PO SOLN: 30.0000 mL | Freq: Once | ORAL | Status: DC

## 2016-07-29 MED ORDER — SODIUM CHLORIDE 0.9 % IJ SOLN
INTRAMUSCULAR | Status: AC
  Filled 2016-07-29: qty 50

## 2016-07-29 MED ORDER — ZOLPIDEM TARTRATE 5 MG PO TABS: 5.0000 mg | ORAL_TABLET | Freq: Every evening | ORAL | Status: DC | PRN

## 2016-07-29 MED ORDER — PHENYLEPHRINE HCL 10 MG/ML IJ SOLN
INTRAMUSCULAR | Status: AC
  Filled 2016-07-29: qty 1

## 2016-07-29 MED ORDER — PHENYLEPHRINE 40 MCG/ML (10ML) SYRINGE FOR IV PUSH (FOR BLOOD PRESSURE SUPPORT)
PREFILLED_SYRINGE | INTRAVENOUS | Status: DC | PRN
  Administered 2016-07-29: 100 ug via INTRAVENOUS

## 2016-07-29 MED ORDER — ACETAMINOPHEN 325 MG PO TABS: 650.0000 mg | ORAL_TABLET | ORAL | Status: DC | PRN

## 2016-07-29 MED ORDER — LIDOCAINE HCL (PF) 1 % IJ SOLN
INTRAMUSCULAR | Status: DC | PRN
  Administered 2016-07-29 (×2): 3 mL via SUBCUTANEOUS

## 2016-07-29 MED ORDER — SIMETHICONE 80 MG PO CHEW
80.0000 mg | CHEWABLE_TABLET | ORAL | Status: DC
  Administered 2016-07-29 – 2016-07-31 (×3): 80 mg via ORAL
  Filled 2016-07-29 (×3): qty 1

## 2016-07-29 MED ORDER — FENTANYL CITRATE (PF) 100 MCG/2ML IJ SOLN
INTRAMUSCULAR | Status: AC
Start: 2016-07-29 — End: 2016-07-29
  Filled 2016-07-29: qty 2

## 2016-07-29 MED ORDER — EPHEDRINE SULFATE 50 MG/ML IJ SOLN
INTRAMUSCULAR | Status: AC
  Filled 2016-07-29: qty 1

## 2016-07-29 MED ORDER — ONDANSETRON HCL 4 MG/2ML IJ SOLN: 4.0000 mg | Freq: Once | INTRAMUSCULAR | Status: DC | PRN

## 2016-07-29 SURGICAL SUPPLY — 21 items
BARRIER ADHS 3X4 INTERCEED (GAUZE/BANDAGES/DRESSINGS) ×3 IMPLANT
CANISTER SUCT 3000ML PPV (MISCELLANEOUS) ×3 IMPLANT
CHLORAPREP W/TINT 26ML (MISCELLANEOUS) ×3 IMPLANT
DRSG TELFA 3X8 NADH (GAUZE/BANDAGES/DRESSINGS) ×3 IMPLANT
ELECT CAUTERY BLADE 6.4 (BLADE) ×3 IMPLANT
ELECT REM PT RETURN 9FT ADLT (ELECTROSURGICAL) ×3
ELECTRODE REM PT RTRN 9FT ADLT (ELECTROSURGICAL) ×1 IMPLANT
GAUZE SPONGE 4X4 12PLY STRL (GAUZE/BANDAGES/DRESSINGS) ×3 IMPLANT
GLOVE BIO SURGEON STRL SZ8 (GLOVE) ×3 IMPLANT
GOWN STRL REUS W/ TWL LRG LVL3 (GOWN DISPOSABLE) ×2 IMPLANT
GOWN STRL REUS W/ TWL XL LVL3 (GOWN DISPOSABLE) ×1 IMPLANT
GOWN STRL REUS W/TWL LRG LVL3 (GOWN DISPOSABLE) ×4
GOWN STRL REUS W/TWL XL LVL3 (GOWN DISPOSABLE) ×2
NS IRRIG 1000ML POUR BTL (IV SOLUTION) ×3 IMPLANT
PACK C SECTION AR (MISCELLANEOUS) ×3 IMPLANT
PAD OB MATERNITY 4.3X12.25 (PERSONAL CARE ITEMS) ×3 IMPLANT
PAD PREP 24X41 OB/GYN DISP (PERSONAL CARE ITEMS) ×3 IMPLANT
STRAP SAFETY BODY (MISCELLANEOUS) ×3 IMPLANT
SUT CHROMIC 1 CTX 36 (SUTURE) ×9 IMPLANT
SUT PLAIN GUT 0 (SUTURE) ×6 IMPLANT
SUT VIC AB 0 CT1 36 (SUTURE) ×6 IMPLANT

## 2016-07-29 NOTE — Progress Notes (Signed)
Patient ID: Gina Foster, female   DOB: 04/20/1985, 31 y.o.   MRN: [id number] Ready for repeat LTCS and BTL  All questions answered . Ready to proceed

## 2016-07-29 NOTE — Anesthesia Post-op Follow-up Note (Cosign Needed)
Anesthesia QCDR form completed.        

## 2016-07-29 NOTE — Op Note (Signed)
NAME:  Gina Foster, Gina Foster            ACCOUNT NO.:  [id number]  MEDICAL RECORD NO.:  [id number]  LOCATION:                                 FACILITY:  PHYSICIAN:  Jennell Cornerhomas Rossy Virag, MDDATE OF BIRTH:  1985/08/08  DATE OF PROCEDURE: DATE OF DISCHARGE:                              OPERATIVE REPORT   PREOPERATIVE DIAGNOSIS: 1. A 40+ 1 weeks' estimated gestational age. 2. Elective repeat cesarean section. 3. Elective permanent sterilization.  POSTOPERATIVE DIAGNOSIS: 1. A 40+ 1 weeks' estimated gestational age. 2. Elective repeat cesarean section. 3. Elective permanent sterilization.  PROCEDURE PERFORMED: 1. Repeat low transverse cesarean section. 2. Bilateral tubal ligation, [place].  SURGEON:  Jennell Cornerhomas Carlisle Torgeson, MD  ANESTHESIA:  Spinal.  SURGEON:  Jennell Cornerhomas Dalilah Curlin, MD.  FIRST ASSISTANT:  Brame, scrub tech.  INDICATION:  A 31 year old gravida 2, para 1 patient at 4140+ 1 weeks' estimated gestational age with a prior history of cesarean section.  The patient elects for repeat cesarean section and elective permanent sterilization.  The patient is aware of the failure rate from tubal ligation 1/200 per year.  DESCRIPTION OF PROCEDURE:  After adequate spinal anesthesia, the patient was placed in dorsal supine position.  Hip rolled on the right side. The patient received 2 g IV Ancef for surgical prophylaxis prior to commencement of the case.  Time-out was performed.  A Pfannenstiel incision was made 2 fingerbreadths above the symphysis pubis.  Sharp dissection was used to identify the fascia, which was opened in the midline and opened in a transverse fashion.  The superior aspect of fascia was grasped with Kocher clamps and the recti muscles were dissected free.  Inferior aspect of the fascia was grasped with Kocher clamps, and pyramidalis muscle was dissected free.  The vesicouterine peritoneal fold was identified.  A bladder flap was created and the bladder was reflected  inferiorly.  A low transverse uterine incision was made upon the entry into the amniotic cavity.  Clear fluid resulted. The incision was extended with blunt transverse traction.  Fetal head was delivered through the incision without difficulty.  A loose nuchal cord was reduced and the shoulders and body were delivered without difficulty.  Vigorous female was dried on the abdomen with delayed cord clamping for 60 seconds.  Apgar scores 9 and 9.  Time of birth was [id number] on July 29, 2016.  Placenta was manually delivered and uterus was exteriorized.  The endometrial cavity was wiped clean with laparotomy tape and cervix was opened with a ring forceps and this was passed off the operative field.  Uterine incision was closed with 1 chromic suture in a running locking fashion.  Good approximation of edges, good hemostasis noted.  Attention directed to the patient's fallopian tubes. The right fallopian tube was grasped in the midportion and 2 separate 0 plain gut sutures were applied to the fallopian tube and a 1.5 cm portion of fallopian tube was removed.  Good hemostasis was noted. Similar procedure was repeated on the patient's left fallopian tube. Again, 2 separate 0 plain gut sutures were applied and a 1.5 cm portion of fallopian tube was removed.  Posterior cul-de-sac was irrigated and suctioned and the uterus was placed back into the abdominal  cavity. Pericolic gutters were wiped clean with laparotomy tape and the tubal ligation sites appeared hemostatic.  Uterine incision appeared hemostatic.  Interceed was placed over the uterine incision in a T- shaped fashion.  The fascia was then closed with 0 Vicryl suture in a running nonlocking fashion.  Good approximation of edges.  Good hemostasis noted.  Fascial area was injected with a solution of 1.3% bupivacaine 20 mL, 0.5% Marcaine 30 mL in 50 mL normal saline, 60 mL was used to inject the fascial edges.  Subcutaneous tissues were  irrigated and bovied for hemostasis and given the depth of the subcutaneous tissues approximately 3.5 cm.  A 2-0 chromic suture was used to close the dead space and the skin was then reapproximated with Insorb absorbable staples, good cosmetic effect, and the incision line was injected with additional 30 mL of bupivacaine-Marcaine-saline solution. There were no complications.  The patient tolerated the procedure well.  ESTIMATED BLOOD LOSS:  500 mL.  URINE OUTPUT:  250 mL.  INTRAOPERATIVE FLUIDS:  750 mL.  DISPOSITION:  The patient was taken to recovery room in good condition.    ______________________________ Jennell Corner, MD   ______________________________ Jennell Corner, MD    TS/MEDQ  D:  07/29/2016  T:  07/29/2016  Job:  [id number]

## 2016-07-29 NOTE — Discharge Summary (Signed)
Obstetric Discharge Summary   Patient ID: Patient Name: Gina Foster DOB: 05/07/1985 MRN: [id number]  Date of Admission: 07/29/2016 Date of Discharge: 08/01/16 [facility] Gestational Age at [facility]: [id number]   Antepartum complications: none . Superficial thrombophlebitis  Admitting Diagnosis:40=1 weeks , elective repeat LTCS and BTL   Secondary Diagnoses: Patient Active Problem List   Diagnosis Date Noted  . Postoperative state 07/29/2016  . Pregnancy 07/25/2016    Augmentation: None Complications: None Intrapartum complications/course:  Elective repeat LTCS  + BTL on 07/29/16 Date of Delivery: 07/29/16 Delivered By: Schermerhorn MD Delivery Type: repeat cesarean section, low transverse incision Anesthesia: spinal  Placenta: sponatneous Laceration:  Episiotomy: none  Newborn Data:female APGAR 9/9 delivered 0804 on 07/29/16   Postpartum Course  Patient had an uncomplicated postpartum course.  By time of discharge on PPD#3POD#, her pain was controlled on oral pain medications; she had appropriate lochia and was ambulating, voiding without difficulty and tolerating regular diet.  She was deemed stable for discharge to home.     Labs: CBC Latest Ref Rng & Units 07/30/2016 07/28/2016 01/23/2016  WBC 3.6 - 11.0 K/uL 12.2(H) 14.2(H) 10.7  Hemoglobin 12.0 - 16.0 g/dL 8.2(L) 12.7 13.4  Hematocrit 35.0 - 47.0 % 24.3(L) 36.6 38.6  Platelets 150 - 440 K/uL 230 270 271   B POS  Physical exam:  BP 120/64 (BP Location: Right Arm)   Pulse 84   Temp 98.4 F (36.9 C) (Oral)   Resp 18   Ht 5\' 9"  (1.753 m)   Wt 102.5 kg (226 lb)   LMP 10/11/2015   SpO2 100%   Breastfeeding? Unknown   BMI 33.37 kg/m  General: alert and no distress Pulm: normal respiratory effort Lochia: appropriate Abdomen: soft, NT Uterine Fundus: firm, below umbilicus ncision: c/d/i, healing well, no significant drainage, no dehiscence, no significant erythema Extremities: No  evidence of DVT seen on physical exam. No lower extremity edema.   Disposition: stable, discharge to home Baby Feeding: breastmilk Baby Disposition: home with mom  Contraception: BTL  Prenatal Labs:     Plan:  Gina Foster was discharged to home in good condition. [facility]charge Instructions: Per After Visit Summary. Activity: Advance as tolerated. Pelvic rest for 6 weeks.  Refer to After Visit Summary Diet: Regular Discharge Medications: Allergies as of 08/01/2016      Reactions   Morphine And Related Hives, Shortness Of Breath      Medication List    STOP taking these medications   cephALEXin 500 MG capsule Commonly known as:  KEFLEX   Doxylamine-Pyridoxine 10-10 MG Tbec   HYDROcodone-acetaminophen 5-325 MG tablet Commonly known as:  NORCO/VICODIN   promethazine 12.5 MG tablet Commonly known as:  PHENERGAN   sucralfate 1 g tablet Commonly known as:  CARAFATE   sulfamethoxazole-trimethoprim 800-160 MG tablet Commonly known as:  BACTRIM DS,SEPTRA DS     TAKE these medications   docusate sodium 100 MG capsule Commonly known as:  COLACE Take 1 capsule (100 mg total) by mouth daily as needed.   ibuprofen 600 MG tablet Commonly known as:  ADVIL,MOTRIN Take 1 tablet (600 mg total) by mouth every 6 (six) hours.   ondansetron 4 MG disintegrating tablet Commonly known as:  ZOFRAN-ODT Take 1 tablet (4 mg total) by mouth every 6 (six) hours as needed for nausea or vomiting.   oxyCODONE 5 MG immediate release tablet Commonly known as:  Oxy IR/ROXICODONE Take 1 tablet (5 mg total)  by mouth every 4 (four) hours as needed for moderate pain.   polyethylene glycol packet Commonly known as:  MIRALAX / GLYCOLAX Take 17 g by mouth daily as needed for mild constipation.   prenatal multivitamin Tabs tablet Take 1 tablet by mouth daily at 12 noon.   ranitidine 150 MG capsule Commonly known as:  ZANTAC Take 1 capsule  (150 mg total) by mouth 2 (two) times daily.      Outpatient follow up:  Follow-up Information    Schermerhorn, Ihor Austin, MD Follow up in 2 week(s).   Specialty:  Obstetrics and Gynecology Why:  post op  Contact information: [address] [place] [address] [phone]            Signed:  Ihor Austin Schermerhorn   08/01/16

## 2016-07-29 NOTE — Brief Op Note (Signed)
07/29/2016  DOS 07/29/16 8:34 AM  PATIENT:  Gina Foster  31 y.o. female  PRE-OPERATIVE DIAGNOSIS:  Prior cesarean,elective sterilization  POST-OPERATIVE DIAGNOSIS:  same  PROCEDURE:  Procedure(s): CESAREAN SECTION WITH BILATERAL TUBAL LIGATION (Bilateral) LTCS and BTL [place] SURGEON:  Surgeon(s) and Role:    * Giovanne Nickolson, Ihor Austinhomas J, MD - Primary  PHYSICIAN ASSISTANT: Brame, scrub tech   ASSISTANTS: none   ANESTHESIA:   spinal  EBL:  Total I/O In: 200 [I.V.:200] Out: 750 [Urine:250; Blood:500]  BLOOD ADMINISTERED:none  DRAINS: Urinary Catheter (Foley)   LOCAL MEDICATIONS USED:  MARCAINE    and BUPIVICAINE   SPECIMEN:  Source of Specimen:  portion bilateral tubes  DISPOSITION OF SPECIMEN:  PATHOLOGY  COUNTS:  YES  TOURNIQUET:  * No tourniquets in log *  DICTATION: .Other Dictation: Dictation Number verbal  PLAN OF CARE: Admit to inpatient   PATIENT DISPOSITION:  PACU - hemodynamically stable.   Delay start of Pharmacological VTE agent (>24hrs) due to surgical blood loss or risk of bleeding: not applicable

## 2016-07-29 NOTE — Op Note (Deleted)
  The note originally documented on this encounter has been moved the the encounter in which it belongs.  

## 2016-07-29 NOTE — Anesthesia Procedure Notes (Addendum)
Spinal  Patient location during procedure: OR Start time: 07/29/2016 7:39 AM End time: 07/29/2016 7:52 AM Staffing Anesthesiologist: Martha Clan Resident/CRNA: Hedda Slade Performed: resident/CRNA and anesthesiologist  Preanesthetic Checklist Completed: patient identified, site marked, surgical consent, pre-op evaluation, timeout performed, IV checked, risks and benefits discussed and monitors and equipment checked Spinal Block Patient position: sitting Prep: ChloraPrep Patient monitoring: heart rate, continuous pulse ox, blood pressure and cardiac monitor Approach: midline Location: L3-4 Injection technique: single-shot Needle Needle type: Whitacre and Introducer  Needle gauge: 25 G Needle length: 9 cm Assessment Sensory level: T4 Additional Notes Negative paresthesia. Negative blood return. Positive free-flowing CSF. Expiration date of kit checked and confirmed. Patient tolerated procedure well, without complications.

## 2016-07-29 NOTE — Anesthesia Preprocedure Evaluation (Signed)
Anesthesia Evaluation  Patient identified by MRN, date of birth, ID band Patient awake    Reviewed: Allergy & Precautions, H&P , NPO status , Patient's Chart, lab work & pertinent test results, reviewed documented beta blocker date and time   History of Anesthesia Complications Negative for: history of anesthetic complications  Airway Mallampati: III  TM Distance: >3 FB Neck ROM: full    Dental  (+) Dental Advidsory Given, Teeth Intact   Pulmonary neg shortness of breath, neg COPD, neg recent URI, Current Smoker,           Cardiovascular Exercise Tolerance: Good negative cardio ROS       Neuro/Psych negative neurological ROS  negative psych ROS   GI/Hepatic Neg liver ROS, GERD  ,  Endo/Other  diabetes, Well Controlled, Type 2  Renal/GU negative Renal ROS  negative genitourinary   Musculoskeletal   Abdominal   Peds  Hematology negative hematology ROS (+)   Anesthesia Other Findings Past Medical History: No date: Diabetes mellitus without complication (HCC) No date: GERD (gastroesophageal reflux disease)   Reproductive/Obstetrics negative OB ROS                             Anesthesia Physical Anesthesia Plan  ASA: II  Anesthesia Plan: Spinal   Post-op Pain Management:    Induction:   PONV Risk Score and Plan:   Airway Management Planned:   Additional Equipment:   Intra-op Plan:   Post-operative Plan:   Informed Consent: I have reviewed the patients History and Physical, chart, labs and discussed the procedure including the risks, benefits and alternatives for the proposed anesthesia with the patient or authorized representative who has indicated his/her understanding and acceptance.   Dental Advisory Given  Plan Discussed with: Anesthesiologist, CRNA and Surgeon  Anesthesia Plan Comments:         Anesthesia Quick Evaluation

## 2016-07-29 NOTE — Transfer of Care (Signed)
Immediate Anesthesia Transfer of Care Note  Patient: Gina Foster  Procedure(s) Performed: Procedure(s): CESAREAN SECTION WITH BILATERAL TUBAL LIGATION (Bilateral)  Patient Location: PACU  Anesthesia Type:Spinal  Level of Consciousness: awake, alert  and oriented  Airway & Oxygen Therapy: Patient Spontanous Breathing  Post-op Assessment: Report given to RN and Post -op Vital signs reviewed and stable  Post vital signs: Reviewed and stable  Last Vitals:  Vitals:   07/29/16 0618 07/29/16 0845  BP: 104/69 (!) 118/57  Pulse: 84 76  Resp: 16 12  Temp: 36.6 C 36.4 C    Last Pain:  Vitals:   07/29/16 0845  TempSrc: Oral  PainSc:          Complications: No apparent anesthesia complications

## 2016-07-30 ENCOUNTER — Encounter: Payer: Self-pay | Admitting: Obstetrics and Gynecology

## 2016-07-30 LAB — SURGICAL PATHOLOGY

## 2016-07-30 LAB — TYPE AND SCREEN
ABO/RH(D): B POS
Antibody Screen: NEGATIVE
EXTEND SAMPLE REASON: UNDETERMINED

## 2016-07-30 LAB — CBC
HEMATOCRIT: 24.3 % — AB (ref 35.0–47.0)
Hemoglobin: 8.2 g/dL — ABNORMAL LOW (ref 12.0–16.0)
MCH: 28.9 pg (ref 26.0–34.0)
MCHC: 33.8 g/dL (ref 32.0–36.0)
MCV: 85.7 fL (ref 80.0–100.0)
Platelets: 230 10*3/uL (ref 150–440)
RBC: 2.84 MIL/uL — ABNORMAL LOW (ref 3.80–5.20)
RDW: 14.4 % (ref 11.5–14.5)
WBC: 12.2 10*3/uL — AB (ref 3.6–11.0)

## 2016-07-30 NOTE — Anesthesia Postprocedure Evaluation (Signed)
Anesthesia Post Note  Patient: Gina Foster  Procedure(s) Performed: Procedure(s) (LRB): CESAREAN SECTION WITH BILATERAL TUBAL LIGATION (Bilateral)  Patient location during evaluation: Mother Baby Anesthesia Type: Spinal Level of consciousness: awake and alert and oriented Pain management: satisfactory to patient Vital Signs Assessment: post-procedure vital signs reviewed and stable Respiratory status: respiratory function stable Cardiovascular status: stable Postop Assessment: no headache, spinal receding, patient able to bend at knees, no signs of nausea or vomiting, no backache and adequate PO intake Anesthetic complications: no     Last Vitals:  Vitals:   07/30/16 0300 07/30/16 0425  BP: (!) 100/41 113/65  Pulse: 73 86  Resp: 18   Temp: 37 C     Last Pain:  Vitals:   07/30/16 0500  TempSrc:   PainSc: Thomasene RippleAsleep                 Jhoselyn Ruffini D

## 2016-07-30 NOTE — Progress Notes (Signed)
Post Partum Day 1 Subjective: pain ok  on percocet and IV toradol   Objective: Blood pressure (!) 106/58, pulse 89, temperature 98.2 F (36.8 C), temperature source Oral, resp. rate 18, height 5\' 9"  (1.753 m), weight 102.5 kg (226 lb), last menstrual period 10/11/2015, SpO2 99 %, unknown if currently breastfeeding.  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

## 2016-07-30 NOTE — Anesthesia Post-op Follow-up Note (Signed)
  Anesthesia Pain Follow-up Note  Patient: Gina Foster  Day #: 1  Date of Follow-up: 07/30/2016 Time: 7:12 AM  Last Vitals:  Vitals:   07/30/16 0300 07/30/16 0425  BP: (!) 100/41 113/65  Pulse: 73 86  Resp: 18   Temp: 37 C     Level of Consciousness: alert  Pain: mild   Side Effects:None  Catheter Site Exam:clean, dry     Plan: D/C from anesthesia care at surgeon's request  Clydene PughBeane, Cuinn Westerhold D

## 2016-07-31 MED ORDER — OXYCODONE HCL 5 MG PO TABS
10.0000 mg | ORAL_TABLET | ORAL | Status: DC | PRN
  Administered 2016-07-31 – 2016-08-01 (×3): 10 mg via ORAL
  Filled 2016-07-31 (×3): qty 2

## 2016-07-31 MED ORDER — IBUPROFEN 600 MG PO TABS
600.0000 mg | ORAL_TABLET | Freq: Four times a day (QID) | ORAL | Status: DC
  Administered 2016-07-31 – 2016-08-01 (×7): 600 mg via ORAL
  Filled 2016-07-31 (×7): qty 1

## 2016-07-31 MED ORDER — ACETAMINOPHEN 500 MG PO TABS
1000.0000 mg | ORAL_TABLET | Freq: Four times a day (QID) | ORAL | Status: DC
  Administered 2016-07-31 – 2016-08-01 (×5): 1000 mg via ORAL
  Filled 2016-07-31 (×6): qty 2

## 2016-07-31 MED ORDER — OXYCODONE HCL 5 MG PO TABS
5.0000 mg | ORAL_TABLET | ORAL | Status: DC | PRN
  Administered 2016-07-31 – 2016-08-01 (×2): 5 mg via ORAL
  Filled 2016-07-31 (×2): qty 1

## 2016-07-31 NOTE — Progress Notes (Signed)
Chaplain was making her rounds and just stopped in to check on Patient. Patient was doing well and she was feeding her newborn.

## 2016-08-01 MED ORDER — OXYCODONE HCL 5 MG PO TABS: 5.0000 mg | ORAL_TABLET | ORAL | 0 refills | Status: DC | PRN

## 2016-08-01 MED ORDER — IBUPROFEN 600 MG PO TABS: 600.0000 mg | ORAL_TABLET | Freq: Four times a day (QID) | ORAL | 0 refills | Status: DC

## 2016-08-01 MED ORDER — VARICELLA VIRUS VACCINE LIVE 1350 PFU/0.5ML IJ SUSR
0.5000 mL | Freq: Once | INTRAMUSCULAR | Status: AC
  Administered 2016-08-01: 0.5 mL via SUBCUTANEOUS
  Filled 2016-08-01 (×2): qty 0.5

## 2016-08-01 MED ORDER — DOCUSATE SODIUM 100 MG PO CAPS: 100.0000 mg | ORAL_CAPSULE | Freq: Every day | ORAL | 2 refills | Status: AC | PRN

## 2016-08-01 MED ORDER — ONDANSETRON 4 MG PO TBDP: 4.0000 mg | ORAL_TABLET | Freq: Four times a day (QID) | ORAL | Status: DC | PRN

## 2016-08-01 MED ORDER — ONDANSETRON 4 MG PO TBDP: 4.0000 mg | ORAL_TABLET | Freq: Four times a day (QID) | ORAL | 0 refills | Status: DC | PRN

## 2016-08-01 NOTE — Progress Notes (Signed)
  Subjective:  Doing well, only percocet was ordered overnight for pain control and this isn't doing a great job.  Voiding, ambulating, tolerating regular PO diet, tolerating pain with PO meds. Denies: CP SOB F/C, N/V, calf pain     Objective:  Blood pressure (!) 98/58, pulse 91, temperature 98.1 F (36.7 C), resp. rate 16, height 5\' 9"  (1.753 m), weight 102.5 kg (226 lb), last menstrual period 10/11/2015, SpO2 99 %, unknown if currently breastfeeding.  General: NAD Pulmonary: no increased work of breathing Abdomen: non-distended, non-tender, fundus firm at level of umbilicus Incision: bandaged, c/d/i. Extremities: no edema, no erythema, no tenderness   Assessment:   31 y.o. [id number] postoperativeday # 2   Plan:  1) Acute blood loss anemia - hemodynamically stable and asymptomatic - po ferrous sulfate  2. Post op cares - continue routine, pain meds scheduled (tylenol and motrin) and PRN (oxycodone) 3. Bandage - take shower then remove by RN. 4. Dispo: continue inpatient   ----- Ranae Plumberhelsea Ward, MD Attending Obstetrician and Gynecologist [facility], Department of OB[facility]GYN [facility]

## 2016-08-01 NOTE — Progress Notes (Signed)
Patient discharge instructions reviewed with patient and significant other.  Patient instructed to confirm follow-up appointment with physician in two weeks.  Signs and symptoms of infection and increased bleeding reviewed with patient.  Infant safe sleeping instructions and cord care reviewed with patient as well.  Patient and spouse acknowledged understanding of instructions.  Patient discharged via wheelchair to visitors entrance and left with significant other.

## 2016-08-04 ENCOUNTER — Emergency Department: Payer: Medicaid Other

## 2016-08-04 ENCOUNTER — Emergency Department
Admission: EM | Admit: 2016-08-04 | Discharge: 2016-08-04 | Disposition: A | Discharge status: Home / Self Care | Payer: Medicaid Other | Attending: Emergency Medicine | Admitting: Emergency Medicine

## 2016-08-04 ENCOUNTER — Encounter: Payer: Self-pay | Admitting: Emergency Medicine

## 2016-08-04 DIAGNOSIS — E119 Type 2 diabetes mellitus without complications: Secondary | ICD-10-CM | POA: Insufficient documentation

## 2016-08-04 DIAGNOSIS — R05 Cough: Secondary | ICD-10-CM

## 2016-08-04 DIAGNOSIS — J181 Lobar pneumonia, unspecified organism: Secondary | ICD-10-CM | POA: Diagnosis not present

## 2016-08-04 DIAGNOSIS — R059 Cough, unspecified: Secondary | ICD-10-CM

## 2016-08-04 DIAGNOSIS — R0600 Dyspnea, unspecified: Secondary | ICD-10-CM | POA: Diagnosis not present

## 2016-08-04 DIAGNOSIS — Z79899 Other long term (current) drug therapy: Secondary | ICD-10-CM | POA: Diagnosis not present

## 2016-08-04 DIAGNOSIS — R600 Localized edema: Secondary | ICD-10-CM | POA: Diagnosis not present

## 2016-08-04 DIAGNOSIS — F17210 Nicotine dependence, cigarettes, uncomplicated: Secondary | ICD-10-CM | POA: Diagnosis not present

## 2016-08-04 DIAGNOSIS — R0602 Shortness of breath: Secondary | ICD-10-CM

## 2016-08-04 DIAGNOSIS — R071 Chest pain on breathing: Secondary | ICD-10-CM | POA: Diagnosis present

## 2016-08-04 DIAGNOSIS — J189 Pneumonia, unspecified organism: Secondary | ICD-10-CM

## 2016-08-04 LAB — CBC
HEMATOCRIT: 30.3 % — AB (ref 35.0–47.0)
Hemoglobin: 10.2 g/dL — ABNORMAL LOW (ref 12.0–16.0)
MCH: 29.8 pg (ref 26.0–34.0)
MCHC: 33.7 g/dL (ref 32.0–36.0)
MCV: 88.5 fL (ref 80.0–100.0)
PLATELETS: 426 10*3/uL (ref 150–440)
RBC: 3.42 MIL/uL — ABNORMAL LOW (ref 3.80–5.20)
RDW: 15 % — AB (ref 11.5–14.5)
WBC: 13 10*3/uL — ABNORMAL HIGH (ref 3.6–11.0)

## 2016-08-04 LAB — BASIC METABOLIC PANEL
Anion gap: 8 (ref 5–15)
BUN: 11 mg/dL (ref 6–20)
CO2: 25 mmol/L (ref 22–32)
CREATININE: 0.57 mg/dL (ref 0.44–1.00)
Calcium: 8.9 mg/dL (ref 8.9–10.3)
Chloride: 107 mmol/L (ref 101–111)
GFR calc Af Amer: >60 mL/min (ref >60)
GLUCOSE: 109 mg/dL — AB (ref 65–99)
POTASSIUM: 3.5 mmol/L (ref 3.5–5.1)
SODIUM: 140 mmol/L (ref 135–145)

## 2016-08-04 LAB — HEPATIC FUNCTION PANEL
ALT: 17 U/L (ref 14–54)
AST: 20 U/L (ref 15–41)
Albumin: 3.1 g/dL — ABNORMAL LOW (ref 3.5–5.0)
Alkaline Phosphatase: 93 U/L (ref 38–126)
BILIRUBIN DIRECT: 0.1 mg/dL (ref 0.1–0.5)
BILIRUBIN TOTAL: 0.9 mg/dL (ref 0.3–1.2)
Indirect Bilirubin: 0.8 mg/dL (ref 0.3–0.9)
Total Protein: 7.1 g/dL (ref 6.5–8.1)

## 2016-08-04 LAB — LACTIC ACID, PLASMA: LACTIC ACID, VENOUS: 0.7 mmol/L (ref 0.5–1.9)

## 2016-08-04 LAB — BRAIN NATRIURETIC PEPTIDE: B NATRIURETIC PEPTIDE 5: 40 pg/mL (ref 0.0–100.0)

## 2016-08-04 LAB — TROPONIN I: Troponin I: <0.03 ng/mL (ref <0.03)

## 2016-08-04 MED ORDER — VANCOMYCIN HCL IN DEXTROSE 1-5 GM/200ML-% IV SOLN: 1000.0000 mg | Freq: Three times a day (TID) | INTRAVENOUS | Status: DC

## 2016-08-04 MED ORDER — AZITHROMYCIN 250 MG PO TABS: ORAL_TABLET | ORAL | 0 refills | Status: DC

## 2016-08-04 MED ORDER — VANCOMYCIN HCL IN DEXTROSE 1-5 GM/200ML-% IV SOLN
1000.0000 mg | Freq: Once | INTRAVENOUS | Status: AC
  Administered 2016-08-04: 1000 mg via INTRAVENOUS
  Filled 2016-08-04: qty 200

## 2016-08-04 MED ORDER — DEXTROSE 5 % IV SOLN
2.0000 g | Freq: Once | INTRAVENOUS | Status: AC
  Administered 2016-08-04: 2 g via INTRAVENOUS
  Filled 2016-08-04: qty 2

## 2016-08-04 MED ORDER — IOPAMIDOL (ISOVUE-370) INJECTION 76%
75.0000 mL | Freq: Once | INTRAVENOUS | Status: AC | PRN
  Administered 2016-08-04: 75 mL via INTRAVENOUS

## 2016-08-04 MED ORDER — DEXTROSE 5 % IV SOLN: 1.0000 g | Freq: Three times a day (TID) | INTRAVENOUS | Status: DC

## 2016-08-04 MED ORDER — AMOXICILLIN-POT CLAVULANATE 875-125 MG PO TABS: 1.0000 | ORAL_TABLET | Freq: Two times a day (BID) | ORAL | 0 refills | Status: DC

## 2016-08-04 NOTE — ED Provider Notes (Signed)
[facility] Emergency Department Provider Note   ____________________________________________   First MD Initiated Contact with Patient 08/04/16 0303     (approximate)  I have reviewed the triage vital signs and the nursing notes.   HISTORY  Chief Complaint Chest Pain and Shortness of Breath    HPI Gina Foster is a 31 y.o. female who presents to the ED from home with a chief complain of cough and shortness of breath. Patient is 6 days status post C-section. Reports coughing and shortness of breath prior to discharge from the hospital. Woke up around 1 AM with shortness of breath and right pleuritic chest pain. States her legs are more swollen now than they were in the hospital. Denies delivery complications such as preeclampsia. Denies prior pregnancy or delivery complications. Denies fever, chills, abdominal pain, nausea, vomiting. Tried to walk around to no avail because she thought it was gaspains.   Past Medical History:  Diagnosis Date  . Diabetes mellitus without complication (HCC)   . GERD (gastroesophageal reflux disease)     Patient Active Problem List   Diagnosis Date Noted  . Postoperative state 07/29/2016  . Pregnancy 07/25/2016    Past Surgical History:  Procedure Laterality Date  . ABDOMINAL SURGERY    . CESAREAN SECTION    . CESAREAN SECTION WITH BILATERAL TUBAL LIGATION Bilateral 07/29/2016   Procedure: CESAREAN SECTION WITH BILATERAL TUBAL LIGATION;  Surgeon: Suzy BouchardSchermerhorn, Thomas J, MD;  Location: [facility] ORS;  Service: Obstetrics;  Laterality: Bilateral;  . CHOLECYSTECTOMY      Prior to Admission medications   Medication Sig Start Date End Date Taking? Authorizing Provider  docusate sodium (COLACE) 100 MG capsule Take 1 capsule (100 mg total) by mouth daily as needed. 08/01/16 08/01/17 Yes Schermerhorn, Ihor Austinhomas J, MD  ibuprofen (ADVIL,MOTRIN) 600 MG tablet Take 1 tablet (600 mg total) by mouth every 6 (six) hours. 08/01/16  Yes  Schermerhorn, Ihor Austinhomas J, MD  oxyCODONE (OXY IR/ROXICODONE) 5 MG immediate release tablet Take 1 tablet (5 mg total) by mouth every 4 (four) hours as needed for moderate pain. 08/01/16  Yes Schermerhorn, Ihor Austinhomas J, MD  polyethylene glycol (MIRALAX / GLYCOLAX) packet Take 17 g by mouth daily as needed for mild constipation.   Yes [provider]  Prenatal Vit-Fe Fumarate-FA (PRENATAL MULTIVITAMIN) TABS tablet Take 1 tablet by mouth daily at 12 noon.   Yes [provider]  ranitidine (ZANTAC) 150 MG capsule Take 1 capsule (150 mg total) by mouth 2 (two) times daily. 09/11/14  Yes Sharman CheekStafford, Phillip, MD  amoxicillin-clavulanate (AUGMENTIN) 875-125 MG tablet Take 1 tablet by mouth 2 (two) times daily. 08/04/16   Irean HongSung, Jade J, MD  azithromycin (ZITHROMAX Z-PAK) 250 MG tablet Take 2 the first day, then 1 per day until finished 08/04/16   Irean HongSung, Jade J, MD    Allergies Morphine and related  Family History  Problem Relation Age of Onset  . Cancer Father   . Diabetes Father   . Hearing loss Father     Social History Social History  Substance Use Topics  . Smoking status: Current Every Day Smoker    Packs/day: 1.00    Years: 15.00    Types: Cigarettes  . Smokeless tobacco: Never Used  . Alcohol use No    Review of Systems  Constitutional: No fever/chills. Eyes: No visual changes. ENT: No sore throat. Cardiovascular: Positive for chest pain. Respiratory: Positive for shortness of breath. Gastrointestinal: No abdominal pain.  No nausea, no vomiting.  No diarrhea.  No constipation. Genitourinary: Negative for dysuria. Musculoskeletal: Negative for back pain. Skin: Negative for rash. Neurological: Negative for headaches, focal weakness or numbness.   ____________________________________________   PHYSICAL EXAM:  VITAL SIGNS: ED Triage Vitals  Enc Vitals Group     BP 08/04/16 0239 128/80     Pulse Rate 08/04/16 0239 (!) 103     Resp 08/04/16 0239 (!) 22     Temp  08/04/16 0239 99.5 F (37.5 C)     Temp Source 08/04/16 0239 Oral     SpO2 08/04/16 0239 100 %     Weight --      Height 08/04/16 0240 5\' 9"  (1.753 m)     Head Circumference --      Peak Flow --      Pain Score 08/04/16 0239 10     Pain Loc --      Pain Edu? --      Excl. in GC? --     Constitutional: Alert and oriented. Uncomfortable appearing and in mild acute distress. Eyes: Conjunctivae are normal. PERRL. EOMI. Head: Atraumatic. Nose: No congestion/rhinnorhea. Mouth/Throat: Mucous membranes are moist.  Oropharynx non-erythematous. Neck: No stridor.   Cardiovascular: Tachycardic rate, regular rhythm. Grossly normal heart sounds.  Good peripheral circulation. Respiratory: Increased respiratory effort. Shallow breathing. No retractions. Lungs with rales bilaterally. Gastrointestinal: Soft and nontender. Incision C/D/I. No distention. No abdominal bruits. No CVA tenderness. Musculoskeletal: No lower extremity tenderness. 2+ BLE edema.  No joint effusions. Neurologic:  Normal speech and language. No gross focal neurologic deficits are appreciated.  Skin:  Skin is warm, dry and intact. No rash noted. Psychiatric: Mood and affect are normal. Speech and behavior are normal.  ____________________________________________   LABS (all labs ordered are listed, but only abnormal results are displayed)  Labs Reviewed  BASIC METABOLIC PANEL - Abnormal; Notable for the following:       Result Value   Glucose, Bld 109 (*)    All other components within normal limits  CBC - Abnormal; Notable for the following:    WBC 13.0 (*)    RBC 3.42 (*)    Hemoglobin 10.2 (*)    HCT 30.3 (*)    RDW 15.0 (*)    All other components within normal limits  HEPATIC FUNCTION PANEL - Abnormal; Notable for the following:    Albumin 3.1 (*)    All other components within normal limits  CULTURE, BLOOD (ROUTINE X 2)  CULTURE, BLOOD (ROUTINE X 2)  URINE CULTURE  TROPONIN I  BRAIN NATRIURETIC PEPTIDE    LACTIC ACID, PLASMA  LACTIC ACID, PLASMA  URINALYSIS, COMPLETE (UACMP) WITH MICROSCOPIC   ____________________________________________  EKG  ED ECG REPORT I, SUNG,JADE J, the attending physician, personally viewed and interpreted this ECG.   Date: 08/04/2016  EKG Time: 0245  Rate: 93  Rhythm: normal EKG, normal sinus rhythm  Axis: Normal  Intervals:none  ST&T Change: Nonspecific  ____________________________________________  RADIOLOGY  Dg Chest 2 View  Result Date: 08/04/2016 CLINICAL DATA:  Right-sided chest pain. Six days post C-section. Nonproductive cough. EXAM: CHEST  2 VIEW COMPARISON:  09/11/2014 FINDINGS: Ill-defined opacities in the right costophrenic angle without lateral view correlate. Minimal retrocardiac atelectasis in the left lung base. Normal heart size and mediastinal contours. No pulmonary edema. No pleural fluid or pneumothorax. No acute osseous abnormality. IMPRESSION: Ill-defined right lung base opacities may be atelectasis or pneumonia. If there is clinical concern for pulmonary embolus/pulmonary infarct, recommend chest CTA. Electronically Signed  By: Rubye Oaks M.D.   On: 08/04/2016 03:19   Ct Angio Chest Pe W/cm &/or Wo Cm  Result Date: 08/04/2016 CLINICAL DATA:  Dyspnea, nonproductive cough and pleuritic right-sided chest pain. EXAM: CT ANGIOGRAPHY CHEST WITH CONTRAST TECHNIQUE: Multidetector CT imaging of the chest was performed using the standard protocol during bolus administration of intravenous contrast. Multiplanar CT image reconstructions and MIPs were obtained to evaluate the vascular anatomy. CONTRAST:  75 mL Isovue 370 intravenous COMPARISON:  01/05/2014 FINDINGS: Cardiovascular: Satisfactory opacification of the pulmonary arteries to the segmental level. No evidence of pulmonary embolism. Normal heart size. No pericardial effusion. Mediastinum/Nodes: Mildly prominent mediastinal and hilar nodes, slightly reduced from 01/05/2014.  Lungs/Pleura: No confluent consolidation. Mild nodularity in the periphery of the right lung, reduced from 01/05/2014. Airways are patent and normal in caliber. No pleural effusions. Upper Abdomen: No acute abnormality. Musculoskeletal: No chest wall abnormality. No acute or significant osseous findings. Review of the MIP images confirms the above findings. IMPRESSION: 1. Negative for acute pulmonary embolism. 2. Mild nodularity a right lung, possibly infectious or inflammatory. This is reduced compared to 01/05/2014. There also are nonspecific mildly prominent nodes in the hila and mediastinum, reduced from 2015. Electronically Signed   By: Ellery Plunk M.D.   On: 08/04/2016 04:21    ____________________________________________   PROCEDURES  Procedure(s) performed: None  Procedures  Critical Care performed: No  ____________________________________________   INITIAL IMPRESSION / ASSESSMENT AND PLAN / ED COURSE  Pertinent labs & imaging results that were available during my care of the patient were reviewed by me and considered in my medical decision making (see chart for details).  31 year old female who is 6 days postpartum via C-section with low-grade fever, cough, pleuritic chest pain. Will obtain screening lab work including BNP and troponin, obtain chest x-ray and reassess. Low threshold to obtain CT chest to evaluate for pulmonary embolism. Will also evaluate for postpartum cardiomyopathy.   Clinical Course as of Aug 04 741  Mon Aug 04, 2016  1610 Updated patient and family of laboratory and CT imaging results. IV antibiotic infusing. Offered patient hospitalization but she desires to go home. Will ambulate patient after completion of IV antibiotics to see how she does.  [JS]  0507 Patient ambulated with steady gait. Maintained room air saturations between 98-100%. She was not tachypneic and spoke as she walked. Will discharge home with antibiotics and she will follow up  closely with her PCP. Strict return precautions given. Patient verbalizes understanding and agrees with plan of care.  [JS]    Clinical Course User Index [JS] Irean Hong, MD     ____________________________________________   FINAL CLINICAL IMPRESSION(S) / ED DIAGNOSES  Final diagnoses:  Community acquired pneumonia of right lower lobe of lung (HCC)  Shortness of breath  Cough      NEW MEDICATIONS STARTED DURING THIS VISIT:  Discharge Medication List as of 08/04/2016  5:14 AM    START taking these medications   Details  amoxicillin-clavulanate (AUGMENTIN) 875-125 MG tablet Take 1 tablet by mouth 2 (two) times daily., Starting Mon 08/04/2016, Print    azithromycin (ZITHROMAX Z-PAK) 250 MG tablet Take 2 the first day, then 1 per day until finished, Print         Note:  This document was prepared using Dragon voice recognition software and may include unintentional dictation errors.    Irean Hong, MD 08/04/16 [phone]

## 2016-08-04 NOTE — ED Notes (Signed)
Patient transported to X-ray 

## 2016-08-04 NOTE — Progress Notes (Signed)
Pharmacy Antibiotic Note  Gina Foster is a 31 y.o. female admitted on 08/04/2016 with pneumonia.  Pharmacy has been consulted for vanc/cefepime dosing.  Plan: Patient received vanc 1g IV and cefepime 2g IV x 1 in ED Will f/u w/ vanc 1g q8h w/ 6 hour stack dosing. Will draw vanc trough 7/24 @ 0900 prior to 4th dose. Will start cefepime 1g IV q8h. Ke 0.108 T1/2 8 hours Goal trough 15 - 20 mcg/mL  Height: 5\' 9"  (175.3 cm) Weight: 229 lb (103.9 kg) IBW/kg (Calculated) : 66.2  Temp (24hrs), Avg:99.5 F (37.5 C), Min:99.5 F (37.5 C), Max:99.5 F (37.5 C)   Recent Labs Lab 07/28/16 0934 07/30/16 0554 08/04/16 0301 08/04/16 0323  WBC 14.2* 12.2* 13.0*  --   CREATININE  --   --  0.57  --   LATICACIDVEN  --   --   --  0.7    Estimated Creatinine Clearance: 130.8 mL/min (by C-G formula based on SCr of 0.57 mg/dL).    Allergies  Allergen Reactions  . Morphine And Related Hives and Shortness Of Breath    Thank you for allowing pharmacy to be a part of this patient's care.  Thomasene Rippleavid Greyden Besecker, PharmD, BCPS Clinical Pharmacist 08/04/2016

## 2016-08-04 NOTE — ED Triage Notes (Signed)
Pt is 6 days post c-section/tubal ligation; c/o waking around 1am with shortness of breath and sharp pain to outer right chest; nonproductive cough; shallow breathing with intermittent soft cough; talking in short sentences; lungs clear in triage

## 2016-08-04 NOTE — ED Provider Notes (Signed)
[facility] Emergency Department Provider Note   ____________________________________________   First MD Initiated Contact with Patient 08/04/16 0303     (approximate)  I have reviewed the triage vital signs and the nursing notes.   HISTORY  Chief Complaint Chest Pain and Shortness of Breath    HPI Gina Foster is a 31 y.o. female who presents to the ED from home with a chief complain of cough and shortness of breath. Patient is 6 days status post C-section. Reports coughing and shortness of breath prior to discharge from the hospital. Woke up around 1 AM with shortness of breath and right pleuritic chest pain. States her legs are more swollen now than they were in the hospital. Denies delivery complications such as preeclampsia. Denies prior pregnancy or delivery complications. Denies fever, chills, abdominal pain, nausea, vomiting. Tried to walk around to no avail because she thought it was gaspains.   Past Medical History:  Diagnosis Date  . Diabetes mellitus without complication (HCC)   . GERD (gastroesophageal reflux disease)     Patient Active Problem List   Diagnosis Date Noted  . Postoperative state 07/29/2016  . Pregnancy 07/25/2016    Past Surgical History:  Procedure Laterality Date  . ABDOMINAL SURGERY    . CESAREAN SECTION    . CESAREAN SECTION WITH BILATERAL TUBAL LIGATION Bilateral 07/29/2016   Procedure: CESAREAN SECTION WITH BILATERAL TUBAL LIGATION;  Surgeon: Suzy BouchardSchermerhorn, Thomas J, MD;  Location: [facility] ORS;  Service: Obstetrics;  Laterality: Bilateral;  . CHOLECYSTECTOMY      Prior to Admission medications   Medication Sig Start Date End Date Taking? Authorizing Provider  docusate sodium (COLACE) 100 MG capsule Take 1 capsule (100 mg total) by mouth daily as needed. 08/01/16 08/01/17 Yes Schermerhorn, Ihor Austinhomas J, MD  ibuprofen (ADVIL,MOTRIN) 600 MG tablet Take 1 tablet (600 mg total) by mouth every 6 (six) hours. 08/01/16  Yes  Schermerhorn, Ihor Austinhomas J, MD  oxyCODONE (OXY IR/ROXICODONE) 5 MG immediate release tablet Take 1 tablet (5 mg total) by mouth every 4 (four) hours as needed for moderate pain. 08/01/16  Yes Schermerhorn, Ihor Austinhomas J, MD  polyethylene glycol (MIRALAX / GLYCOLAX) packet Take 17 g by mouth daily as needed for mild constipation.   Yes [provider]  Prenatal Vit-Fe Fumarate-FA (PRENATAL MULTIVITAMIN) TABS tablet Take 1 tablet by mouth daily at 12 noon.   Yes [provider]  ranitidine (ZANTAC) 150 MG capsule Take 1 capsule (150 mg total) by mouth 2 (two) times daily. 09/11/14  Yes Sharman CheekStafford, Phillip, MD  amoxicillin-clavulanate (AUGMENTIN) 875-125 MG tablet Take 1 tablet by mouth 2 (two) times daily. 08/04/16   Irean HongSung, Jade J, MD  azithromycin (ZITHROMAX Z-PAK) 250 MG tablet Take 2 the first day, then 1 per day until finished 08/04/16   Irean HongSung, Jade J, MD    Allergies Morphine and related  Family History  Problem Relation Age of Onset  . Cancer Father   . Diabetes Father   . Hearing loss Father     Social History Social History  Substance Use Topics  . Smoking status: Current Every Day Smoker    Packs/day: 1.00    Years: 15.00    Types: Cigarettes  . Smokeless tobacco: Never Used  . Alcohol use No    Review of Systems  Constitutional: No fever/chills. Eyes: No visual changes. ENT: No sore throat. Cardiovascular: Positive for chest pain. Respiratory: Positive for shortness of breath. Gastrointestinal: No abdominal pain.  No nausea, no vomiting.  No diarrhea.  No constipation. Genitourinary: Negative for dysuria. Musculoskeletal: Negative for back pain. Skin: Negative for rash. Neurological: Negative for headaches, focal weakness or numbness.   ____________________________________________   PHYSICAL EXAM:  VITAL SIGNS: ED Triage Vitals  Enc Vitals Group     BP 08/04/16 0239 128/80     Pulse Rate 08/04/16 0239 (!) 103     Resp 08/04/16 0239 (!) 22     Temp  08/04/16 0239 99.5 F (37.5 C)     Temp Source 08/04/16 0239 Oral     SpO2 08/04/16 0239 100 %     Weight --      Height 08/04/16 0240 5\' 9"  (1.753 m)     Head Circumference --      Peak Flow --      Pain Score 08/04/16 0239 10     Pain Loc --      Pain Edu? --      Excl. in GC? --     Constitutional: Alert and oriented. Uncomfortable appearing and in mild acute distress. Eyes: Conjunctivae are normal. PERRL. EOMI. Head: Atraumatic. Nose: No congestion/rhinnorhea. Mouth/Throat: Mucous membranes are moist.  Oropharynx non-erythematous. Neck: No stridor.   Cardiovascular: Tachycardic rate, regular rhythm. Grossly normal heart sounds.  Good peripheral circulation. Respiratory: Increased respiratory effort. Shallow breathing. No retractions. Lungs with rales bilaterally. Gastrointestinal: Soft and nontender. Incision C/D/I. No distention. No abdominal bruits. No CVA tenderness. Musculoskeletal: No lower extremity tenderness. 2+ BLE edema.  No joint effusions. Neurologic:  Normal speech and language. No gross focal neurologic deficits are appreciated.  Skin:  Skin is warm, dry and intact. No rash noted. Psychiatric: Mood and affect are normal. Speech and behavior are normal.  ____________________________________________   LABS (all labs ordered are listed, but only abnormal results are displayed)  Labs Reviewed  BASIC METABOLIC PANEL - Abnormal; Notable for the following:       Result Value   Glucose, Bld 109 (*)    All other components within normal limits  CBC - Abnormal; Notable for the following:    WBC 13.0 (*)    RBC 3.42 (*)    Hemoglobin 10.2 (*)    HCT 30.3 (*)    RDW 15.0 (*)    All other components within normal limits  HEPATIC FUNCTION PANEL - Abnormal; Notable for the following:    Albumin 3.1 (*)    All other components within normal limits  CULTURE, BLOOD (ROUTINE X 2)  CULTURE, BLOOD (ROUTINE X 2)  URINE CULTURE  TROPONIN I  BRAIN NATRIURETIC PEPTIDE    LACTIC ACID, PLASMA  LACTIC ACID, PLASMA  URINALYSIS, COMPLETE (UACMP) WITH MICROSCOPIC   ____________________________________________  EKG  ED ECG REPORT I, SUNG,JADE J, the attending physician, personally viewed and interpreted this ECG.   Date: 08/04/2016  EKG Time: 0245  Rate: 93  Rhythm: normal EKG, normal sinus rhythm  Axis: Normal  Intervals:none  ST&T Change: Nonspecific  ____________________________________________  RADIOLOGY  Dg Chest 2 View  Result Date: 08/04/2016 CLINICAL DATA:  Right-sided chest pain. Six days post C-section. Nonproductive cough. EXAM: CHEST  2 VIEW COMPARISON:  09/11/2014 FINDINGS: Ill-defined opacities in the right costophrenic angle without lateral view correlate. Minimal retrocardiac atelectasis in the left lung base. Normal heart size and mediastinal contours. No pulmonary edema. No pleural fluid or pneumothorax. No acute osseous abnormality. IMPRESSION: Ill-defined right lung base opacities may be atelectasis or pneumonia. If there is clinical concern for pulmonary embolus/pulmonary infarct, recommend chest CTA. Electronically Signed  By: Rubye OaksMelanie  Ehinger M.D.   On: 08/04/2016 03:19   Ct Angio Chest Pe W/cm &/or Wo Cm  Result Date: 08/04/2016 CLINICAL DATA:  Dyspnea, nonproductive cough and pleuritic right-sided chest pain. EXAM: CT ANGIOGRAPHY CHEST WITH CONTRAST TECHNIQUE: Multidetector CT imaging of the chest was performed using the standard protocol during bolus administration of intravenous contrast. Multiplanar CT image reconstructions and MIPs were obtained to evaluate the vascular anatomy. CONTRAST:  75 mL Isovue 370 intravenous COMPARISON:  01/05/2014 FINDINGS: Cardiovascular: Satisfactory opacification of the pulmonary arteries to the segmental level. No evidence of pulmonary embolism. Normal heart size. No pericardial effusion. Mediastinum/Nodes: Mildly prominent mediastinal and hilar nodes, slightly reduced from 01/05/2014.  Lungs/Pleura: No confluent consolidation. Mild nodularity in the periphery of the right lung, reduced from 01/05/2014. Airways are patent and normal in caliber. No pleural effusions. Upper Abdomen: No acute abnormality. Musculoskeletal: No chest wall abnormality. No acute or significant osseous findings. Review of the MIP images confirms the above findings. IMPRESSION: 1. Negative for acute pulmonary embolism. 2. Mild nodularity a right lung, possibly infectious or inflammatory. This is reduced compared to 01/05/2014. There also are nonspecific mildly prominent nodes in the hila and mediastinum, reduced from 2015. Electronically Signed   By: Ellery Plunkaniel R Mitchell M.D.   On: 08/04/2016 04:21    ____________________________________________   PROCEDURES  Procedure(s) performed: None  Procedures  Critical Care performed: No  ____________________________________________   INITIAL IMPRESSION / ASSESSMENT AND PLAN / ED COURSE  Pertinent labs & imaging results that were available during my care of the patient were reviewed by me and considered in my medical decision making (see chart for details).  31 year old female who is 6 days status post C-section presenting with cough and shortness of breath. Will obtain screening lab work, chest x-ray and reassess. Will also evaluate patient for postpartum cardiomyopathy. Less suspicious of postpartum preeclampsia as patient is normotensive without headache or upper abdominal pain.  Clinical Course as of Aug 04 741  Mon Aug 04, 2016  [id number] Updated patient and family of laboratory and CT imaging results. IV antibiotic infusing. Offered patient hospitalization but she desires to go home. Will ambulate patient after completion of IV antibiotics to see how she does.  [JS]  0507 Patient ambulated with steady gait. Maintained room air saturations between 98-100%. She was not tachypneic and spoke as she walked. Will discharge home with antibiotics and she will follow  up closely with her PCP. Strict return precautions given. Patient verbalizes understanding and agrees with plan of care.  [JS]    Clinical Course User Index [JS] Irean HongSung, Jade J, MD     ____________________________________________   FINAL CLINICAL IMPRESSION(S) / ED DIAGNOSES  Final diagnoses:  Community acquired pneumonia of right lower lobe of lung (HCC)  Shortness of breath  Cough      NEW MEDICATIONS STARTED DURING THIS VISIT:  Discharge Medication List as of 08/04/2016  5:14 AM    START taking these medications   Details  amoxicillin-clavulanate (AUGMENTIN) 875-125 MG tablet Take 1 tablet by mouth 2 (two) times daily., Starting Mon 08/04/2016, Print    azithromycin (ZITHROMAX Z-PAK) 250 MG tablet Take 2 the first day, then 1 per day until finished, Print         Note:  This document was prepared using Dragon voice recognition software and may include unintentional dictation errors.    Irean HongSung, Jade J, MD 08/04/16 [phone]

## 2016-08-04 NOTE — Discharge Instructions (Signed)
1. Take antibiotics as prescribed: Augmentin 875 mg twice daily 7 days Z-Pak 2. Use incentive spirometer as instructed. 3. Return to the ER for worsening symptoms, persistent vomiting, difficulty breathing, fever or other concerns.

## 2016-08-09 LAB — CULTURE, BLOOD (ROUTINE X 2)
CULTURE: NO GROWTH
Culture: NO GROWTH
Special Requests: ADEQUATE
Special Requests: ADEQUATE

## 2016-08-18 NOTE — Discharge Summary (Signed)
TRIAGE NOTE to rule out Preterm Labor   History of Present Illness: Gina Foster is a 31 y.o. G2P1001 a presenting to triage for evaluation after fall, not on abdomen.  Good fetal movement, no vaginal bleeding no LOF. No contractions, some back pain. Hx of prior C/S.  Patient Active Problem List   Diagnosis Date Noted  . Postoperative state 07/29/2016  . Pregnancy 07/25/2016    Past Medical History:  Diagnosis Date  . Diabetes mellitus without complication (HCC)   . GERD (gastroesophageal reflux disease)     Past Surgical History:  Procedure Laterality Date  . ABDOMINAL SURGERY    . CESAREAN SECTION    . CESAREAN SECTION WITH BILATERAL TUBAL LIGATION Bilateral 07/29/2016   Procedure: CESAREAN SECTION WITH BILATERAL TUBAL LIGATION;  Surgeon: Suzy BouchardSchermerhorn, Thomas J, MD;  Location: [facility] ORS;  Service: Obstetrics;  Laterality: Bilateral;  . CHOLECYSTECTOMY      OB History  Gravida Para Term Preterm AB Living  2 2 2     2   SAB TAB Ectopic Multiple Live Births        0 2    # Outcome Date GA Lbr Len/2nd Weight Sex Delivery Anes PTL Lv  2 Term 07/29/16 [id number]  7 lb 7.6 oz (3.39 kg) F CS-LTranv Spinal  LIV  1 Term 04/07/07     CS-Unspec   LIV      Social History   Social History  . Marital status: Single    Spouse name: N/A  . Number of children: N/A  . Years of education: N/A   Social History Main Topics  . Smoking status: Current Every Day Smoker    Packs/day: 1.00    Years: 15.00    Types: Cigarettes  . Smokeless tobacco: Never Used  . Alcohol use No  . Drug use: No  . Sexual activity: Yes     Comment: plans for BTL   Other Topics Concern  . None   Social History Narrative  . None    Family History  Problem Relation Age of Onset  . Cancer Father   . Diabetes Father   . Hearing loss Father     Allergies  Allergen Reactions  . Morphine And Related Hives and Shortness Of Breath    No prescriptions prior to admission.    Review of Systems -  See HPI for OB specific ROS.   Vitals:  BP 109/68 (BP Location: Right Arm)   Pulse 82   Resp 18   LMP 10/11/2015  Physical Examination: CONSTITUTIONAL: Well-developed, well-nourished female in no acute distress.  HENT:  Normocephalic, atraumatic EYES: Conjunctivae and EOM are normal. No scleral icterus.  NECK: Normal range of motion, supple, SKIN: Skin is warm and dry. No rash noted. Not diaphoretic. No erythema. No pallor. NEUROLGIC: Alert and oriented to person, place, and time. No gross cranial nerve deficit noted. PSYCHIATRIC: Normal mood and affect. Normal behavior. Normal judgment and thought content. CARDIOVASCULAR: Normal heart rate noted, regular rhythm RESPIRATORY: Effort and breath sounds normal, no problems with respiration noted ABDOMEN: Soft, nontender, nondistended, gravid.  Cervix: not evaluated Membranes:intact Fetal Monitoring:Baseline: 150 bpm, Variability: Good {> 6 bpm), Accelerations: Reactive and Decelerations: Absent Tocometer: Flat  Labs:  No results found for this or any previous visit (from the past 24 hour(s)).  Imaging Studies:    Assessment and Plan: Patient Active Problem List   Diagnosis Date Noted  . Postoperative state 07/29/2016  . Pregnancy 07/25/2016    1. 4hrs of  monitoring, reactive strip, no evidence of fetal or uterine compromise. D/C home with precautions, f/u as planned 2.   Cline Cools, MD, MPH

## 2017-05-28 ENCOUNTER — Emergency Department: Payer: Self-pay

## 2017-05-28 ENCOUNTER — Emergency Department
Admission: EM | Admit: 2017-05-28 | Discharge: 2017-05-28 | Disposition: A | Discharge status: Home / Self Care | Payer: Self-pay | Attending: Emergency Medicine | Admitting: Emergency Medicine

## 2017-05-28 ENCOUNTER — Encounter: Payer: Self-pay | Admitting: Emergency Medicine

## 2017-05-28 ENCOUNTER — Other Ambulatory Visit: Payer: Self-pay

## 2017-05-28 DIAGNOSIS — J209 Acute bronchitis, unspecified: Secondary | ICD-10-CM | POA: Insufficient documentation

## 2017-05-28 DIAGNOSIS — E119 Type 2 diabetes mellitus without complications: Secondary | ICD-10-CM | POA: Insufficient documentation

## 2017-05-28 DIAGNOSIS — F17210 Nicotine dependence, cigarettes, uncomplicated: Secondary | ICD-10-CM | POA: Insufficient documentation

## 2017-05-28 MED ORDER — IPRATROPIUM-ALBUTEROL 0.5-2.5 (3) MG/3ML IN SOLN
3.0000 mL | Freq: Once | RESPIRATORY_TRACT | Status: AC
  Administered 2017-05-28: 3 mL via RESPIRATORY_TRACT
  Filled 2017-05-28: qty 3

## 2017-05-28 MED ORDER — AZITHROMYCIN 250 MG PO TABS: ORAL_TABLET | ORAL | 0 refills | Status: DC

## 2017-05-28 MED ORDER — PREDNISONE 10 MG PO TABS: ORAL_TABLET | ORAL | 0 refills | Status: DC

## 2017-05-28 MED ORDER — IPRATROPIUM-ALBUTEROL 0.5-2.5 (3) MG/3ML IN SOLN: 3.0000 mL | RESPIRATORY_TRACT | 3 refills | Status: AC | PRN

## 2017-05-28 MED ORDER — ALBUTEROL SULFATE (2.5 MG/3ML) 0.083% IN NEBU
5.0000 mg | INHALATION_SOLUTION | Freq: Once | RESPIRATORY_TRACT | Status: AC
  Administered 2017-05-28: 5 mg via RESPIRATORY_TRACT
  Filled 2017-05-28: qty 6

## 2017-05-28 NOTE — ED Provider Notes (Signed)
[facility] Emergency Department Provider Note  ____________________________________________   First MD Initiated Contact with Patient 05/28/17 1147     (approximate)  I have reviewed the triage vital signs and the nursing notes.   HISTORY  Chief Complaint Cough; Chest Pain (with cough); and Nasal Congestion    HPI Gina Foster is a 32 y.o. female presents to the emergency department stating she has been coughing since March.  She had the flu than she was treated for pneumonia but the cough is not gone away.  She is still coughing up yellow to green mucus.  Her pneumonia been treated with amoxicillin.  She denies cardiac type chest pain.  She states the pain is stabbing when she coughs.  She states her ribs are sore from coughing.  She denies fever or chills.  She denies any vomiting or diarrhea.  Past Medical History:  Diagnosis Date  . Diabetes mellitus without complication (HCC)   . GERD (gastroesophageal reflux disease)     Patient Active Problem List   Diagnosis Date Noted  . Postoperative state 07/29/2016  . Pregnancy 07/25/2016    Past Surgical History:  Procedure Laterality Date  . ABDOMINAL SURGERY    . CESAREAN SECTION    . CESAREAN SECTION WITH BILATERAL TUBAL LIGATION Bilateral 07/29/2016   Procedure: CESAREAN SECTION WITH BILATERAL TUBAL LIGATION;  Surgeon: Suzy Bouchard, MD;  Location: [facility] ORS;  Service: Obstetrics;  Laterality: Bilateral;  . CHOLECYSTECTOMY      Prior to Admission medications   Medication Sig Start Date End Date Taking? Authorizing Provider  azithromycin (ZITHROMAX Z-PAK) 250 MG tablet 2 pills today then 1 pill a day for 4 days 05/28/17   Sherrie Mustache Roselyn Bering, PA-C  docusate sodium (COLACE) 100 MG capsule Take 1 capsule (100 mg total) by mouth daily as needed. 08/01/16 08/01/17  Schermerhorn, Ihor Austin, MD  ipratropium-albuterol (DUONEB) 0.5-2.5 (3) MG/3ML SOLN Take 3 mLs by nebulization every 4 (four) hours  as needed. 05/28/17   Fisher, Roselyn Bering, PA-C  predniSONE (DELTASONE) 10 MG tablet Take 6 pills on day 1 and decrease by 1 pill each day until all are gone.  Take these early in the morning. 05/28/17   Faythe Ghee, PA-C    Allergies Morphine and related  Family History  Problem Relation Age of Onset  . Cancer Father   . Diabetes Father   . Hearing loss Father     Social History Social History   Tobacco Use  . Smoking status: Current Every Day Smoker    Packs/day: 1.00    Years: 15.00    Pack years: 15.00    Types: Cigarettes  . Smokeless tobacco: Never Used  Substance Use Topics  . Alcohol use: No  . Drug use: No    Review of Systems  Constitutional: No fever/chills Eyes: No visual changes. ENT: No sore throat. Respiratory: Positive for shortness of breath, cough, and wheezing Cardiovascular: Denies cardiac type chest pain Genitourinary: Negative for dysuria. Musculoskeletal: Negative for back pain. Skin: Negative for rash.    ____________________________________________   PHYSICAL EXAM:  VITAL SIGNS: ED Triage Vitals  Enc Vitals Group     BP 05/28/17 1026 135/66     Pulse Rate 05/28/17 1026 81     Resp 05/28/17 1026 16     Temp 05/28/17 1026 98.1 F (36.7 C)     Temp Source 05/28/17 1026 Oral     SpO2 05/28/17 1026 100 %  Weight 05/28/17 1027 225 lb (102.1 kg)     Height 05/28/17 1027  (1.753 m)     Head Circumference --      Peak Flow --      Pain Score 05/28/17 1027 7     Pain Loc --      Pain Edu? --      Excl. in GC? --     Constitutional: Alert and oriented. Well appearing and in no acute distress. Eyes: Conjunctivae are normal.  Head: Atraumatic. Nose: No congestion/rhinnorhea. Mouth/Throat: Mucous membranes are moist.  Throat appears normal Neck: Is supple, no lymphadenopathy is noted Cardiovascular: Normal rate, regular rhythm.  Heart sounds are normal, no murmur rub or gallop is noted Respiratory: Normal respiratory effort.   No retractions, lungs with diffuse wheezing bilaterally GU: deferred Musculoskeletal: FROM all extremities, warm and well perfused Neurologic:  Normal speech and language.  Skin:  Skin is warm, dry and intact. No rash noted. Psychiatric: Mood and affect are normal. Speech and behavior are normal.  ____________________________________________   LABS (all labs ordered are listed, but only abnormal results are displayed)  Labs Reviewed  URINALYSIS, ROUTINE W REFLEX MICROSCOPIC  POC URINE PREG, ED   ____________________________________________   ____________________________________________  RADIOLOGY  Chest x-ray is negative for pneumonia  ____________________________________________   PROCEDURES  Procedure(s) performed: Albuterol nebulizer treatment x1, DuoNeb nebulizer treatment x1  Procedures    ____________________________________________   INITIAL IMPRESSION / ASSESSMENT AND PLAN / ED COURSE  Pertinent labs & imaging results that were available during my care of the patient were reviewed by me and considered in my medical decision making (see chart for details).  Patient is a 32 year old female presents emergency department complaining of cough since March.  She was treated for pneumonia with amoxicillin.  She states ever since she had the flu she has not been able to get rid of the cough.  She is a smoker.  On physical exam the patient has diffuse wheezing in both lung fields.  Albuterol nebulizer treatment was given with mild relief.  DuoNeb nebulizer treatment was given with more relief.  The patient has much increased air movement and decreased wheezing after the nebulizer treatments.  Discussed the chest x-ray results and her EKG.  Also explained the exam findings.  She states she understands all.  She was diagnosed with acute bronchitis.  She is given a prescription for Z-Pak, prednisone 6-day Dosepak, DuoNeb Nebules as the patient has a nebulizer machine for  her daughter at home.  She is to return to the emergency department if worsening.  She states she will comply with her treatment plan.  She was discharged in stable condition.  She was giving smoking cessation instructions.  She states she will try to stop smoking.     As part of my medical decision making, I reviewed the following data within the electronic MEDICAL RECORD NUMBER Nursing notes reviewed and incorporated, EKG interpreted NSR and interpreted by heart station Dr., Old chart reviewed, Radiograph reviewed chest x-rays negative for pneumonia, Notes from prior ED visits and [place] Controlled Substance Database  ____________________________________________   FINAL CLINICAL IMPRESSION(S) / ED DIAGNOSES  Final diagnoses:  Acute bronchitis, unspecified organism      NEW MEDICATIONS STARTED DURING THIS VISIT:  Discharge Medication List as of 05/28/2017  1:09 PM    START taking these medications   Details  ipratropium-albuterol (DUONEB) 0.5-2.5 (3) MG/3ML SOLN Take 3 mLs by nebulization every 4 (four) hours as needed., Starting  Thu 05/28/2017, Print    predniSONE (DELTASONE) 10 MG tablet Take 6 pills on day 1 and decrease by 1 pill each day until all are gone.  Take these early in the morning., Print         Note:  This document was prepared using Dragon voice recognition software and may include unintentional dictation errors.    Faythe Ghee, PA-C 05/28/17 1625    Emily Filbert, MD 06/01/17 0700

## 2017-05-28 NOTE — ED Triage Notes (Signed)
Pt states she has been coughing since beginning of march, was treated for Pneumonia at that time, feels like it is not going away. Coughing in triage, dry cough. Describes pain in her chest as stabbing pain with cough.

## 2017-05-28 NOTE — Discharge Instructions (Addendum)
Follow-up with your regular doctor the acute care if not better in 3 to 5 days.  Use medication as prescribed.  If you are worsening please return the emergency department.  Smoking cessation will be especially important that she will not recover fully if you do not quit smoking.  Steps to quit smoking have been provided for you.

## 2018-04-18 ENCOUNTER — Emergency Department: Payer: Self-pay

## 2018-04-18 ENCOUNTER — Other Ambulatory Visit: Payer: Self-pay

## 2018-04-18 ENCOUNTER — Emergency Department
Admission: EM | Admit: 2018-04-18 | Discharge: 2018-04-18 | Disposition: A | Discharge status: Home / Self Care | Payer: Self-pay | Attending: Student in an Organized Health Care Education/Training Program | Admitting: Student in an Organized Health Care Education/Training Program

## 2018-04-18 DIAGNOSIS — J111 Influenza due to unidentified influenza virus with other respiratory manifestations: Secondary | ICD-10-CM | POA: Insufficient documentation

## 2018-04-18 DIAGNOSIS — E119 Type 2 diabetes mellitus without complications: Secondary | ICD-10-CM | POA: Insufficient documentation

## 2018-04-18 DIAGNOSIS — F17210 Nicotine dependence, cigarettes, uncomplicated: Secondary | ICD-10-CM | POA: Insufficient documentation

## 2018-04-18 DIAGNOSIS — Z20828 Contact with and (suspected) exposure to other viral communicable diseases: Secondary | ICD-10-CM | POA: Insufficient documentation

## 2018-04-18 DIAGNOSIS — R69 Illness, unspecified: Secondary | ICD-10-CM

## 2018-04-18 LAB — CBC WITH DIFFERENTIAL/PLATELET
Abs Immature Granulocytes: 0.03 10*3/uL (ref 0.00–0.07)
Basophils Absolute: 0 10*3/uL (ref 0.0–0.1)
Basophils Relative: 0 %
Eosinophils Absolute: 0.2 10*3/uL (ref 0.0–0.5)
Eosinophils Relative: 2 %
HCT: 43.7 % (ref 36.0–46.0)
Hemoglobin: 14.1 g/dL (ref 12.0–15.0)
Immature Granulocytes: 0 %
Lymphocytes Relative: 25 %
Lymphs Abs: 2.7 10*3/uL (ref 0.7–4.0)
MCH: 29 pg (ref 26.0–34.0)
MCHC: 32.3 g/dL (ref 30.0–36.0)
MCV: 89.9 fL (ref 80.0–100.0)
Monocytes Absolute: 0.5 10*3/uL (ref 0.1–1.0)
Monocytes Relative: 5 %
Neutro Abs: 7.1 10*3/uL (ref 1.7–7.7)
Neutrophils Relative %: 68 %
Platelets: 322 10*3/uL (ref 150–400)
RBC: 4.86 MIL/uL (ref 3.87–5.11)
RDW: 13.8 % (ref 11.5–15.5)
WBC: 10.6 10*3/uL — ABNORMAL HIGH (ref 4.0–10.5)
nRBC: 0 % (ref 0.0–0.2)

## 2018-04-18 LAB — INFLUENZA PANEL BY PCR (TYPE A & B)
Influenza A By PCR: NEGATIVE
Influenza B By PCR: NEGATIVE

## 2018-04-18 LAB — COMPREHENSIVE METABOLIC PANEL
ALT: 13 U/L (ref 0–44)
AST: 16 U/L (ref 15–41)
Albumin: 4 g/dL (ref 3.5–5.0)
Alkaline Phosphatase: 62 U/L (ref 38–126)
Anion gap: 8 (ref 5–15)
BUN: 9 mg/dL (ref 6–20)
CO2: 25 mmol/L (ref 22–32)
Calcium: 8.7 mg/dL — ABNORMAL LOW (ref 8.9–10.3)
Chloride: 106 mmol/L (ref 98–111)
Creatinine, Ser: 0.71 mg/dL (ref 0.44–1.00)
GFR calc Af Amer: >60 mL/min (ref >60)
GFR calc non Af Amer: >60 mL/min (ref >60)
Glucose, Bld: 149 mg/dL — ABNORMAL HIGH (ref 70–99)
Potassium: 3.4 mmol/L — ABNORMAL LOW (ref 3.5–5.1)
Sodium: 139 mmol/L (ref 135–145)
Total Bilirubin: 0.5 mg/dL (ref 0.3–1.2)
Total Protein: 7.1 g/dL (ref 6.5–8.1)

## 2018-04-18 MED ORDER — SODIUM CHLORIDE 0.9 % IV BOLUS
1000.0000 mL | Freq: Once | INTRAVENOUS | Status: AC
  Administered 2018-04-18: 1000 mL via INTRAVENOUS

## 2018-04-18 MED ORDER — ONDANSETRON HCL 4 MG PO TABS: 4.0000 mg | ORAL_TABLET | Freq: Every day | ORAL | 0 refills | Status: AC | PRN

## 2018-04-18 MED ORDER — AMOXICILLIN-POT CLAVULANATE 875-125 MG PO TABS: 1.0000 | ORAL_TABLET | Freq: Two times a day (BID) | ORAL | 0 refills | Status: AC

## 2018-04-18 NOTE — ED Notes (Signed)
MD Robinson at bedside 

## 2018-04-18 NOTE — Discharge Instructions (Signed)
You have a viral illness which can have symptoms like muscle aches, fevers, chills, runny nose, cough, sneezing, sore throat, vomiting or diarrhea. One of the viruses that can cause this is SARS- CoV-2, the virus that causes COVID-19, also known as the novel coronavirus. You could also have a different viral infection such as the common cold or flu. Most patients with viral illness including COVID-19 have mild symptoms and recover on their own. Resting, staying hydrated, and sleeping are helpful. Today we think you are well enough to go home and treat your symptoms with oral liquids, and medicine for fevers, cough, and pain. ° °We generally do not do COVID-19 testing on people with mild symptoms who are being discharged from the Emergency Department or Clinic.  ° °If we did a test for COVID-19 the results will not be available for several days. We will call you with the result. Please DO NOT CONTACT THE EMERGENCY DEPARTMENT OR CLINIC FOR RESULTS OF THIS TEST.  ° °Please follow the precautions below:  °Stay home for at least 7 days after your symptoms began OR for 3 days after your fever ends, whichever takes longer. ? ° °If people live with you they should also stay home and avoid contact with others for 14 days.? ° ° °ISOLATION GUIDANCE FOR POSSIBLE COVID °Most people with cough and fever have an illness caused by a virus. One is COVID-19. Not all people with these infections are being tested for the virus that causes COVID-19. People who might have COVID but are not being tested should still try to prevent the spread of the infection. ° °These instructions are modified recommendations from the [place] Department of Health and Human Services. ° °People who might have COVID-19 should follow the instructions below until a doctor or health department says they can stop. ° °Stay home unless you need to see a doctor °Stop doing things outside your home except for getting medical care. Do not go to work, school,  or public areas; and do not use public transportation or taxis. ° °Call ahead before visiting the doctor °Before your appointment, call the doctor's office and tell them about your symptoms. This will help them take steps to keep other people from getting infected.  ° °Keep track of your symptoms °Symptoms are the things you feel, like fever or trouble breathing. Go to your doctor or the ER if you think you are getting worse, like having more trouble breathing. Call the doctor's office and tell them about your symptoms. This will help them take steps to keep other people from getting sick.  ° °Wear a face mask °You should wear a face mask that covers your nose and mouth when you are in the same room with other people and when you visit the doctor's office. People who live with you or visit you should also wear a face mask when they are in the same room with you. ° °Separate yourself from other people in your home °You should stay in a different room from other people in your home. You should stay separate from your family members as much as possible. You should use a separate bathroom if you can. ° °Avoid sharing things in your house °Don't share dishes, drinking glasses, cups, eating utensils, towels, bedding, or other things with people in your home. After using these things please wash them really well with soap and water. ° °Cover your coughs and sneezes °Cover your mouth and nose with a tissue when you   cough or sneeze, or you can cough or sneeze into your sleeve. Throw used tissues in a trash can that has a bag in it, and immediately wash your hands with soap and water for at least 20 seconds. If you use an alcohol-based hand rub please rub your hands together for 20 seconds. ° °Wash your hands °Wash your hands often and very well with soap and water for at least 20 seconds. If your hands are not visibly dirty you can use an alcohol-based hand sanitizer. Don't touch your eyes, nose, or mouth with unwashed  hands. ° ° ° °Instructions for People Helping Care for Patients with Possible COVID-19 °Follow your doctor's instructions °Make sure that you understand and can help the patient follow any instructions for care. ° °Provide for the patient's basic needs °You should help the patient with basic needs in the home and provide support for getting groceries, prescriptions, and other personal needs. ° °Keep track of the patient's symptoms °If they are getting sicker, call his or her doctor. This will help the doctor's office take steps to keep other people from getting infected. ° °Limit the number of people who have contact with the patient °If possible, have only one caregiver for the patient. °Other family members should stay in another home or place of residence. If they can't, they should stay in another room and stay separated from the patient as much as possible. °Keep one bathroom JUST for the patient if you can.  °Only allow visitors if they MUST be in the home. ° °Keep older adults, very young children, and other sick people away from the patient °Keep older adults, very young children, and people who have compromised immune systems or chronic health conditions away from the patient. This includes people with chronic heart, lung, or kidney conditions, diabetes, and cancer. ° °Wash your hands often °Avoid touching your eyes, nose, and mouth with unwashed hands. °Wash your hands often and thoroughly with soap and water for at least 20 seconds. You can use an alcohol-based hand sanitizer if soap and water are not available and if your hands are not visibly dirty.  °Use disposable paper towels to dry your hands. If not available, use dedicated cloth towels and replace them when they become wet. ° °Avoid contamination from face masks and gloves °Wear a disposable face mask and gloves whenever you are touching the patient, things in their room or bathroom, or things that can have their body fluid on them, like bedding  or dishes, or blood, vomit, urine, or feces (poop).  °Ensure the mask fits over your nose and mouth tightly, and do not touch it at all while you are wearing it. °Throw out disposable facemasks and gloves after using them. Do not reuse. °Wash your hands immediately after removing your facemask and gloves. °If your personal clothing becomes dirty with a patient's body fluids, carefully remove clothing and launder. Wash your hands after handling dirty clothing. °Place all used disposable facemasks, gloves, and other waste in a lined container before disposing them with other household garbage.  °Remove gloves and wash your hands immediately after handling these items. ° °Do not share dishes, glasses, or other household items with the patient °Avoid sharing household items. You should not share dishes, drinking glasses, cups, eating utensils, towels, bedding, or other items with a patient who is confirmed to have, or being evaluated for, COVID-19 infection.  °After the person uses these items, you should wash them very well with soap and   water. ° °Wash laundry thoroughly °Immediately remove and wash clothes or bedding that have blood, body fluids, and/or secretions or excretions, such as sweat, saliva, sputum, nasal mucus, vomit, urine, or feces, on them.  °Wear gloves when handling laundry from the patient.  °Read and follow directions on labels of laundry or clothing items and detergent. In general, wash and dry with the warmest temperatures recommended on the label. ° °Clean all areas the individual has used  °Clean all touchable surfaces, such as counters, tabletops, doorknobs, bathroom fixtures, toilets, phones, keyboards, tablets, and bedside tables, every day. Also, clean any surfaces that may have blood, body fluids, and/or secretions or excretions on them. Wear gloves when cleaning surfaces the patient has come in contact with.  °Use a diluted bleach solution (dilute bleach with 1 part bleach and 10 parts  water) or a household disinfectant with a label that says EPA-registered for coronaviruses. To make a bleach solution at home, add 1 tablespoon of bleach to 1 quart (4 cups) of water. For a larger supply, add ¼ cup of bleach to 1 gallon (16 cups) of water.  °Read labels of cleaning products and follow recommendations provided on product labels. Labels contain instructions for safe and effective use of the cleaning product including precautions you should take when applying the product, such as wearing gloves or eye protection and making sure you have good ventilation during use of the product.  °Remove gloves and wash hands immediately after cleaning. ° °Monitor yourself for signs and symptoms of illness °Caregivers and household members are considered close contacts, should monitor their health, and will be asked to limit movement outside of the home as much as possible.  ° °If you have additional questions °Contact  °Your Doctor or Healthcare Provider °The [facility] website (http://www.[facility].edu/coronavirus) °The [facility] COVID hotline 336-70COVID °Your local health department  ° °This guidance is subject to change. For the most up to date guidance, check the state Department of Health website at [facility].GOV ° °

## 2018-04-18 NOTE — ED Triage Notes (Signed)
Patient reports symptoms began Friday - Reports thought it was pollen at first, but Friday night started with fever 103 at home.  Reports headache and vomiting.

## 2018-04-18 NOTE — ED Provider Notes (Signed)
[facility] Emergency Department Provider Note    First MD Initiated Contact with Patient 04/18/18 1947     (approximate)  I have reviewed the triage vital signs and the nursing notes.   HISTORY  Chief Complaint Cough and Fever    HPI Gina Foster is a 33 y.o. female below listed past medical history presents the ER with persistent fever since Friday.  States that she is having associated myalgias dry cough nasal congestion sore throat and headache.  Not have any neck stiffness.  No numbness or tingling.  Not been on any antibiotics.  Has been treating the fever and symptoms with over-the-counter medications.  Does not have any known sick contacts.  Denies any abdominal pain or diarrhea.  Denies any dysuria or flank pain. States he came in which she is concerned due to decreased oral intake.    Past Medical History:  Diagnosis Date  . Diabetes mellitus without complication (HCC)   . GERD (gastroesophageal reflux disease)    Family History  Problem Relation Age of Onset  . Cancer Father   . Diabetes Father   . Hearing loss Father    Past Surgical History:  Procedure Laterality Date  . ABDOMINAL SURGERY    . CESAREAN SECTION    . CESAREAN SECTION WITH BILATERAL TUBAL LIGATION Bilateral 07/29/2016   Procedure: CESAREAN SECTION WITH BILATERAL TUBAL LIGATION;  Surgeon: Suzy Bouchard, MD;  Location: [facility] ORS;  Service: Obstetrics;  Laterality: Bilateral;  . CHOLECYSTECTOMY     Patient Active Problem List   Diagnosis Date Noted  . Postoperative state 07/29/2016  . Pregnancy 07/25/2016      Prior to Admission medications   Medication Sig Start Date End Date Taking? Authorizing Provider  amoxicillin-clavulanate (AUGMENTIN) 875-125 MG tablet Take 1 tablet by mouth 2 (two) times daily for 7 days. 04/18/18 04/25/18  Willy Eddy, MD  azithromycin (ZITHROMAX Z-PAK) 250 MG tablet 2 pills today then 1 pill a day for 4 days 05/28/17   Sherrie Mustache  Roselyn Bering, PA-C  ipratropium-albuterol (DUONEB) 0.5-2.5 (3) MG/3ML SOLN Take 3 mLs by nebulization every 4 (four) hours as needed. 05/28/17   Fisher, Roselyn Bering, PA-C  ondansetron (ZOFRAN) 4 MG tablet Take 1 tablet (4 mg total) by mouth daily as needed for nausea or vomiting. 04/18/18 04/18/19  Willy Eddy, MD  predniSONE (DELTASONE) 10 MG tablet Take 6 pills on day 1 and decrease by 1 pill each day until all are gone.  Take these early in the morning. 05/28/17   Sherrie Mustache Roselyn Bering, PA-C    Allergies Morphine and related    Social History Social History   Tobacco Use  . Smoking status: Current Every Day Smoker    Packs/day: 1.00    Years: 15.00    Pack years: 15.00    Types: Cigarettes  . Smokeless tobacco: Never Used  Substance Use Topics  . Alcohol use: No  . Drug use: No    Review of Systems Patient denies headaches, rhinorrhea, blurry vision, numbness, shortness of breath, chest pain, edema, cough, abdominal pain, nausea, vomiting, diarrhea, dysuria, fevers, rashes or hallucinations unless otherwise stated above in HPI. ____________________________________________   PHYSICAL EXAM:  VITAL SIGNS: Vitals:   04/18/18 1944  BP: 132/85  Pulse: 84  Resp: 18  Temp: 98.8 F (37.1 C)  SpO2: 100%    Constitutional: Alert and oriented.  Eyes: Conjunctivae are normal.  Head: Atraumatic. Nose: No congestion/rhinnorhea. Mouth/Throat: Mucous membranes are moist.   Neck:  No stridor. Painless ROM.  Cardiovascular: Normal rate, regular rhythm. Grossly normal heart sounds.  Good peripheral circulation. Respiratory: Normal respiratory effort.  No retractions. Lungs CTAB. Gastrointestinal: Soft and nontender in all four quadrants to deep palpation. No distention. No abdominal bruits. No CVA tenderness. Genitourinary:  Musculoskeletal: No lower extremity tenderness nor edema.  No joint effusions. Neurologic:  Normal speech and language. No gross focal neurologic deficits are appreciated.  No facial droop Skin:  Skin is warm, dry and intact. No rash noted. Psychiatric: Mood and affect are normal. Speech and behavior are normal.  ____________________________________________   LABS (all labs ordered are listed, but only abnormal results are displayed)  Results for orders placed or performed during the hospital encounter of 04/18/18 (from the past 24 hour(s))  CBC with Differential/Platelet     Status: Abnormal   Collection Time: 04/18/18  8:01 PM  Result Value Ref Range   WBC 10.6 (H) 4.0 - 10.5 K/uL   RBC 4.86 3.87 - 5.11 MIL/uL   Hemoglobin 14.1 12.0 - 15.0 g/dL   HCT 16.143.7 09.636.0 - 04.546.0 %   MCV 89.9 80.0 - 100.0 fL   MCH 29.0 26.0 - 34.0 pg   MCHC 32.3 30.0 - 36.0 g/dL   RDW 40.913.8 81.111.5 - 91.415.5 %   Platelets 322 150 - 400 K/uL   nRBC 0.0 0.0 - 0.2 %   Neutrophils Relative % 68 %   Neutro Abs 7.1 1.7 - 7.7 K/uL   Lymphocytes Relative 25 %   Lymphs Abs 2.7 0.7 - 4.0 K/uL   Monocytes Relative 5 %   Monocytes Absolute 0.5 0.1 - 1.0 K/uL   Eosinophils Relative 2 %   Eosinophils Absolute 0.2 0.0 - 0.5 K/uL   Basophils Relative 0 %   Basophils Absolute 0.0 0.0 - 0.1 K/uL   Immature Granulocytes 0 %   Abs Immature Granulocytes 0.03 0.00 - 0.07 K/uL  Comprehensive metabolic panel     Status: Abnormal   Collection Time: 04/18/18  8:01 PM  Result Value Ref Range   Sodium 139 135 - 145 mmol/L   Potassium 3.4 (L) 3.5 - 5.1 mmol/L   Chloride 106 98 - 111 mmol/L   CO2 25 22 - 32 mmol/L   Glucose, Bld 149 (H) 70 - 99 mg/dL   BUN 9 6 - 20 mg/dL   Creatinine, Ser 7.820.71 0.44 - 1.00 mg/dL   Calcium 8.7 (L) 8.9 - 10.3 mg/dL   Total Protein 7.1 6.5 - 8.1 g/dL   Albumin 4.0 3.5 - 5.0 g/dL   AST 16 15 - 41 U/L   ALT 13 0 - 44 U/L   Alkaline Phosphatase 62 38 - 126 U/L   Total Bilirubin 0.5 0.3 - 1.2 mg/dL   GFR calc non Af Amer >60 >60 mL/min   GFR calc Af Amer >60 >60 mL/min   Anion gap 8 5 - 15   ____________________________________________   ____________________________________________  RADIOLOGY I personally reviewed all radiographic images ordered to evaluate for the above acute complaints and reviewed radiology reports and findings.  These findings were personally discussed with the patient.  Please see medical record for radiology report.   ____________________________________________   PROCEDURES  Procedure(s) performed:  Procedures    Critical Care performed: no ____________________________________________   INITIAL IMPRESSION / ASSESSMENT AND PLAN / ED COURSE  Pertinent labs & imaging results that were available during my care of the patient were reviewed by me and considered in my medical decision making (see  chart for details).   DDX: uri, pna, bronchitis, sinusitis, viral illness, doubt mening or encephalitis  Gina Foster is a 33 y.o. who presents to the ED with flulike symptoms as described above she is currently afebrile and hemodynamically stable.  No meningismus.  Reassuring exam.  No respiratory distress.  Blood will be sent for the by differential will provide IV hydration.  Was high suspicion for viral URI.  Will send for flu as well as COVID-19.  Pending blood work assuming there is no evidence of significant AK I do believe patient will be stable for outpatient follow-up.  Have discussed with the patient and available family all diagnostics and treatments performed thus far and all questions were answered to the best of my ability. The patient demonstrates understanding and agreement with plan.   Clinical Course as of Apr 18 2038  Wynelle Link Apr 18, 2018  2038 Blood work is reassuring.  No evidence of pneumonia or consolidation.  Do not see any occasion for hospitalization at this time given her symptoms concerning for sinusitis will treat with antibiotics will give prescription for antiemetic.   [PR]    Clinical Course User Index [PR] Willy Eddy, MD    The patient was evaluated in  Emergency Department today for the symptoms described in the history of present illness. He/she was evaluated in the context of the global COVID-19 pandemic, which necessitated consideration that the patient might be at risk for infection with the SARS-CoV-2 virus that causes COVID-19. Institutional protocols and algorithms that pertain to the evaluation of patients at risk for COVID-19 are in a state of rapid change based on information released by regulatory bodies including the CDC and federal and state organizations. These policies and algorithms were followed during the patient's care in the ED.  As part of my medical decision making, I reviewed the following data within the electronic MEDICAL RECORD NUMBER Nursing notes reviewed and incorporated, Labs reviewed, notes from prior ED visits.  ____________________________________________   FINAL CLINICAL IMPRESSION(S) / ED DIAGNOSES  Final diagnoses:  Influenza-like illness      NEW MEDICATIONS STARTED DURING THIS VISIT:  New Prescriptions   AMOXICILLIN-CLAVULANATE (AUGMENTIN) 875-125 MG TABLET    Take 1 tablet by mouth 2 (two) times daily for 7 days.   ONDANSETRON (ZOFRAN) 4 MG TABLET    Take 1 tablet (4 mg total) by mouth daily as needed for nausea or vomiting.     Note:  This document was prepared using Dragon voice recognition software and may include unintentional dictation errors.    Willy Eddy, MD 04/18/18 2040

## 2018-04-21 LAB — NOVEL CORONAVIRUS, NAA (HOSP ORDER, SEND-OUT TO REF LAB; TAT 18-24 HRS): SARS-CoV-2, NAA: NOT DETECTED

## 2018-04-22 ENCOUNTER — Telehealth: Payer: Self-pay | Admitting: Emergency Medicine

## 2018-04-22 NOTE — Telephone Encounter (Signed)
Called patient and gave her covid 19 result. 

## 2018-06-23 IMAGING — CT CT ANGIO CHEST
2 of 6 series · 19 of 46 positions shown · IV contrast (APPLIED)
Comparison: 01/05/2014

CLINICAL DATA: Dyspnea, nonproductive cough and pleuritic
right-sided chest pain.

EXAM:
CT ANGIOGRAPHY CHEST WITH CONTRAST
TECHNIQUE: Multidetector CT imaging of the chest was performed using the
standard protocol during bolus administration of intravenous
contrast. Multiplanar CT image reconstructions and MIPs were
obtained to evaluate the vascular anatomy.
CONTRAST:  75 mL Isovue 370 intravenous

[Series 5: thins · axial · 0.66mm/px · z∈[-698,-446]mm · 17 of 278 slices shown]
[im 13/278  lung]
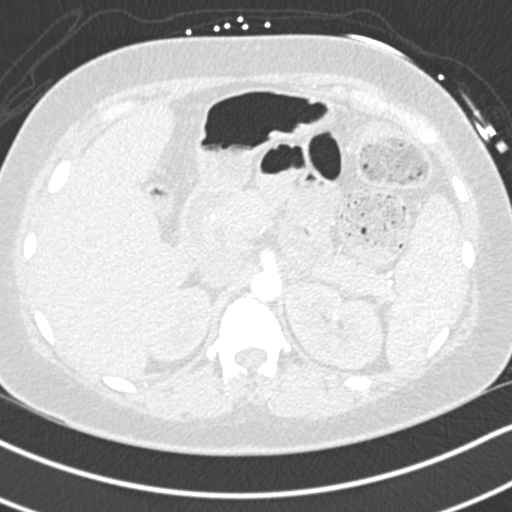
[im 25/278  soft-tissue]
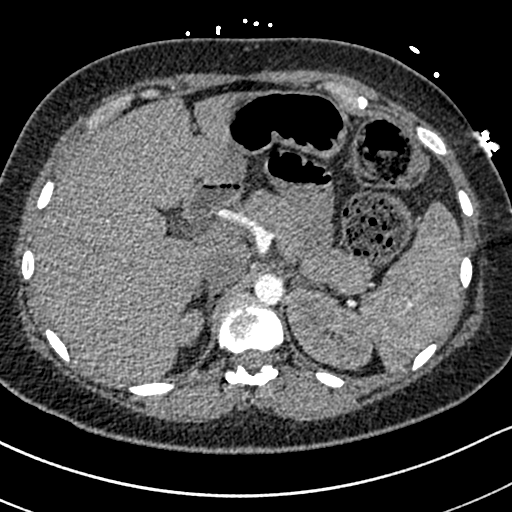
[im 49/278  lung]
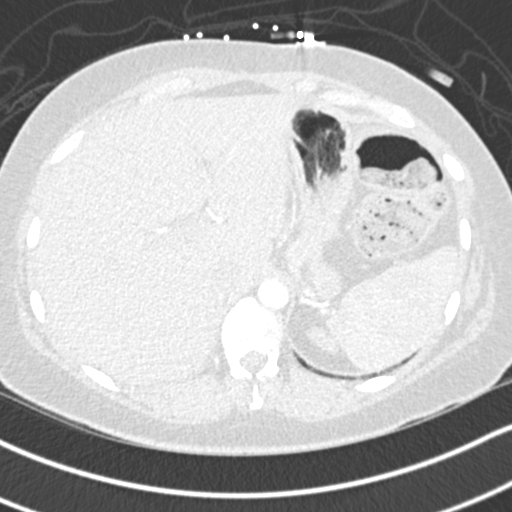
[im 61/278  soft-tissue]
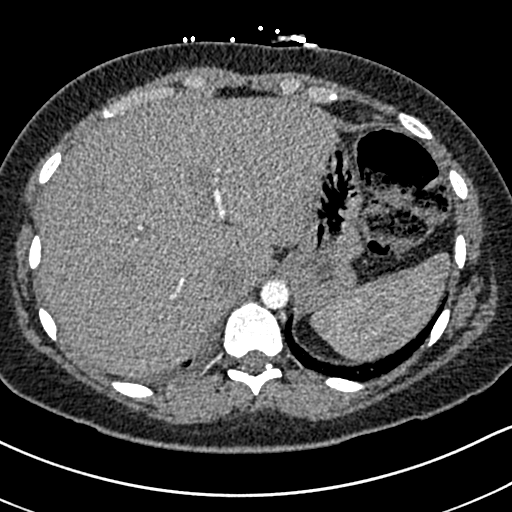
[im 73/278  lung]
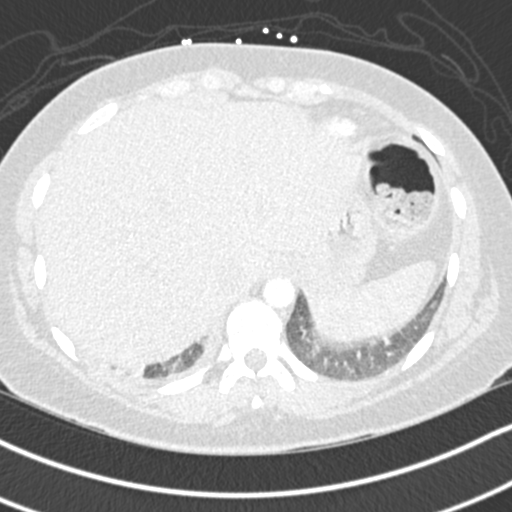
[im 97/278  soft-tissue]
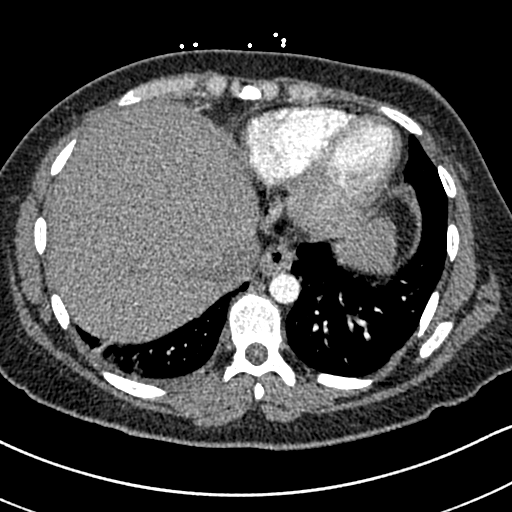
[im 109/278  lung]
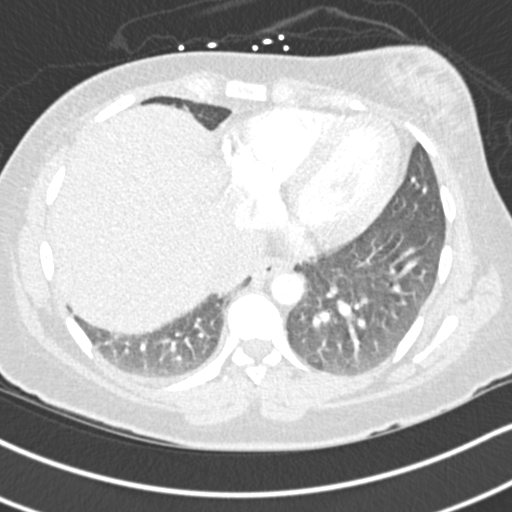
[im 121/278  soft-tissue]
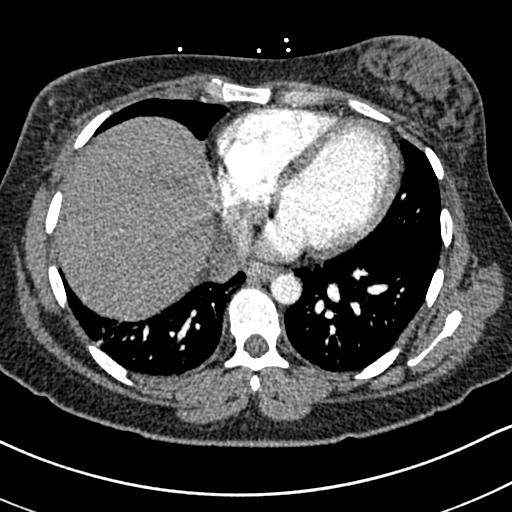
[im 145/278  lung]
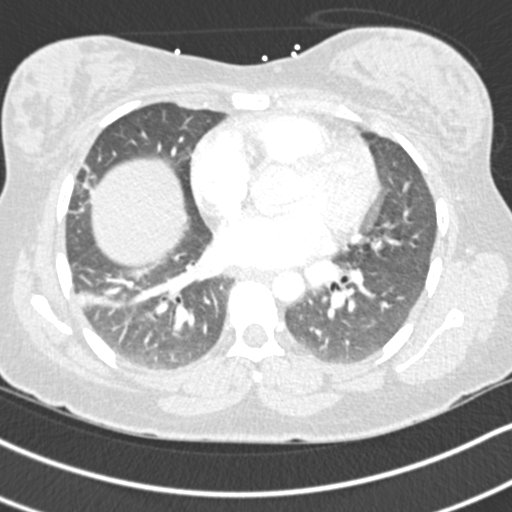
[im 157/278  soft-tissue]
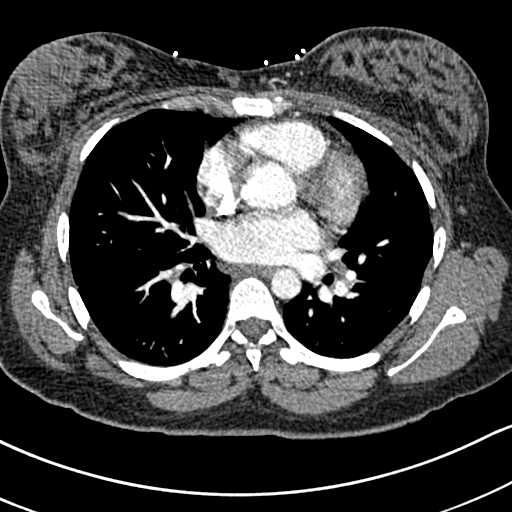
[im 169/278  lung]
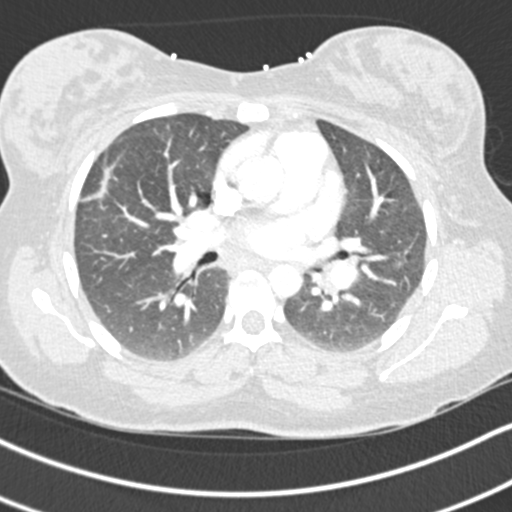
[im 181/278  soft-tissue]
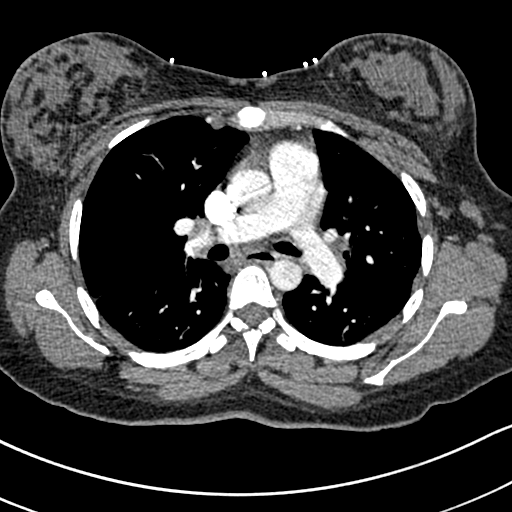
[im 205/278  lung]
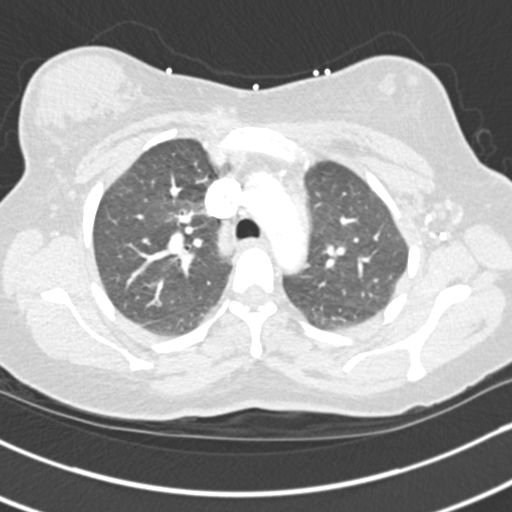
[im 217/278  soft-tissue]
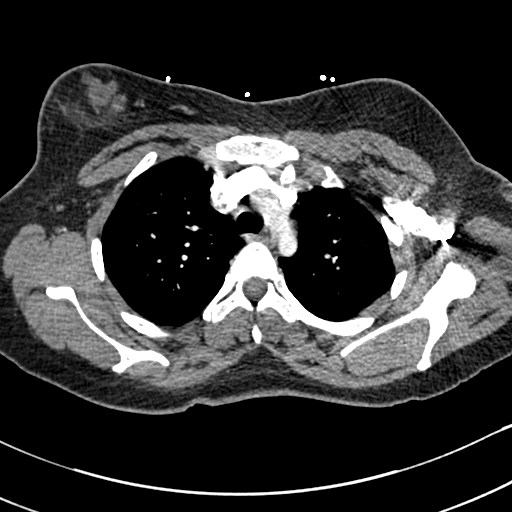
[im 229/278  lung]
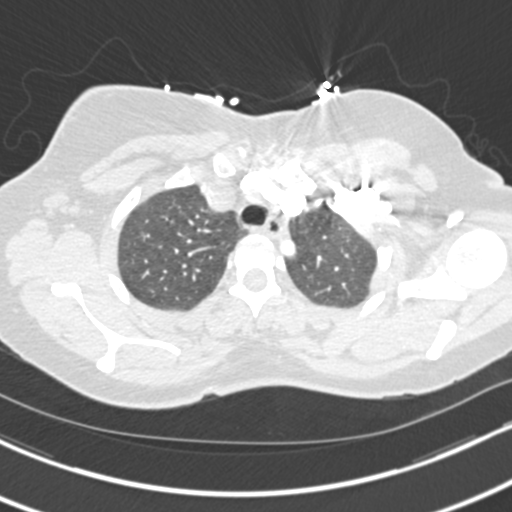
[im 253/278  soft-tissue]
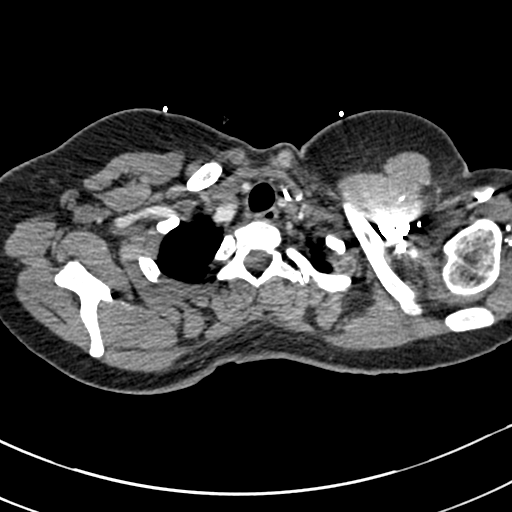
[im 265/278  lung]
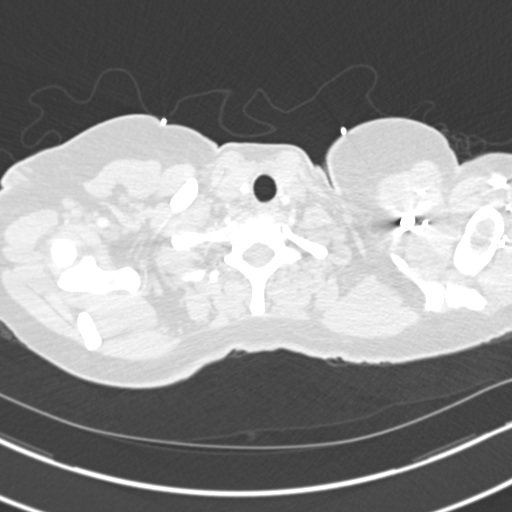

[Series 7: coronal mpr · coronal · 0.54mm/px · 2 of 88 slices shown]
[im 30/88  soft-tissue]
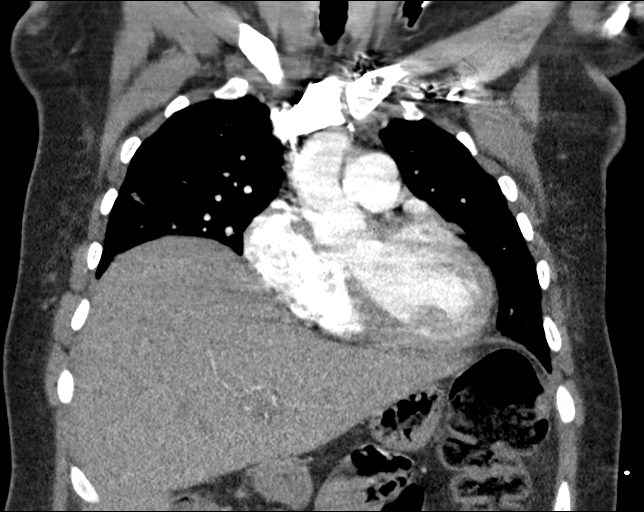
[im 59/88  soft-tissue]
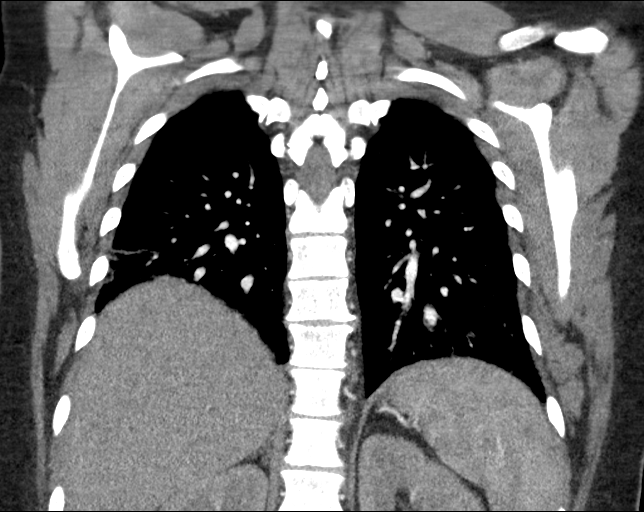

[19 of 46 positions shown; findings below may reference images not displayed]

FINDINGS: Cardiovascular: Satisfactory opacification of the pulmonary arteries
to the segmental level. No evidence of pulmonary embolism. Normal
heart size. No pericardial effusion.

Mediastinum/Nodes: Mildly prominent mediastinal and hilar nodes,
slightly reduced from 01/05/2014.

Lungs/Pleura: No confluent consolidation. Mild nodularity in the
periphery of the right lung, reduced from 01/05/2014. Airways are
patent and normal in caliber. No pleural effusions.

Upper Abdomen: No acute abnormality.

Musculoskeletal: No chest wall abnormality. No acute or significant
osseous findings.

Review of the MIP images confirms the above findings.
IMPRESSION: 1. Negative for acute pulmonary embolism.
2. Mild nodularity a right lung, possibly infectious or
inflammatory. This is reduced compared to 01/05/2014. There also are
nonspecific mildly prominent nodes in the hila and mediastinum,
reduced from 7073.

## 2019-04-14 ENCOUNTER — Ambulatory Visit: Payer: Self-pay | Attending: Internal Medicine

## 2019-04-14 DIAGNOSIS — Z23 Encounter for immunization: Secondary | ICD-10-CM

## 2019-04-14 NOTE — Progress Notes (Signed)
   Covid-19 Vaccination Clinic  Name:  Gina Foster    MRN: [id number] DOB: Dec 09, 1985  04/14/2019  [name] was observed post Covid-19 immunization for 15 minutes without incident. She was provided with Vaccine Information Sheet and instruction to access the V-Safe system.   [name] was instructed to call 911 with any severe reactions post vaccine: Marland Kitchen Difficulty breathing  . Swelling of face and throat  . A fast heartbeat  . A bad rash all over body  . Dizziness and weakness   Immunizations Administered    Name Date Dose VIS Date Route   Pfizer COVID-19 Vaccine 04/14/2019  7:10 PM 0.3 mL 12/24/2018 Intramuscular   Manufacturer: [employer], [employer]   Lot: [id number]   NDC: [address]-3

## 2019-05-06 ENCOUNTER — Ambulatory Visit: Payer: Self-pay | Attending: Internal Medicine

## 2019-08-01 ENCOUNTER — Other Ambulatory Visit: Payer: Self-pay

## 2019-08-01 ENCOUNTER — Emergency Department: Payer: Self-pay

## 2019-08-01 ENCOUNTER — Emergency Department
Admission: EM | Admit: 2019-08-01 | Discharge: 2019-08-01 | Disposition: A | Discharge status: Home / Self Care | Payer: Self-pay | Attending: Emergency Medicine | Admitting: Emergency Medicine

## 2019-08-01 DIAGNOSIS — E119 Type 2 diabetes mellitus without complications: Secondary | ICD-10-CM | POA: Insufficient documentation

## 2019-08-01 DIAGNOSIS — F17210 Nicotine dependence, cigarettes, uncomplicated: Secondary | ICD-10-CM | POA: Insufficient documentation

## 2019-08-01 DIAGNOSIS — J40 Bronchitis, not specified as acute or chronic: Secondary | ICD-10-CM | POA: Insufficient documentation

## 2019-08-01 LAB — CBC
HCT: 44.7 % (ref 36.0–46.0)
Hemoglobin: 14.7 g/dL (ref 12.0–15.0)
MCH: 29.8 pg (ref 26.0–34.0)
MCHC: 32.9 g/dL (ref 30.0–36.0)
MCV: 90.5 fL (ref 80.0–100.0)
Platelets: 295 10*3/uL (ref 150–400)
RBC: 4.94 MIL/uL (ref 3.87–5.11)
RDW: 13.7 % (ref 11.5–15.5)
WBC: 10.6 10*3/uL — ABNORMAL HIGH (ref 4.0–10.5)
nRBC: 0 % (ref 0.0–0.2)

## 2019-08-01 LAB — BASIC METABOLIC PANEL
Anion gap: 6 (ref 5–15)
BUN: 7 mg/dL (ref 6–20)
CO2: 25 mmol/L (ref 22–32)
Calcium: 8.8 mg/dL — ABNORMAL LOW (ref 8.9–10.3)
Chloride: 108 mmol/L (ref 98–111)
Creatinine, Ser: 0.59 mg/dL (ref 0.44–1.00)
GFR calc Af Amer: >60 mL/min (ref >60)
GFR calc non Af Amer: >60 mL/min (ref >60)
Glucose, Bld: 90 mg/dL (ref 70–99)
Potassium: 4.2 mmol/L (ref 3.5–5.1)
Sodium: 139 mmol/L (ref 135–145)

## 2019-08-01 LAB — POCT PREGNANCY, URINE: Preg Test, Ur: NEGATIVE

## 2019-08-01 MED ORDER — IOHEXOL 350 MG/ML SOLN
75.0000 mL | Freq: Once | INTRAVENOUS | Status: AC | PRN
  Administered 2019-08-01: 75 mL via INTRAVENOUS
  Filled 2019-08-01: qty 75

## 2019-08-01 MED ORDER — AZITHROMYCIN 250 MG PO TABS
ORAL_TABLET | ORAL | 0 refills | Status: AC
Start: 2019-08-01 — End: 2019-08-06

## 2019-08-01 MED ORDER — GUAIFENESIN-CODEINE 100-10 MG/5ML PO SOLN: 5.0000 mL | Freq: Four times a day (QID) | ORAL | 0 refills | Status: AC | PRN

## 2019-08-01 MED ORDER — PREDNISONE 20 MG PO TABS: 40.0000 mg | ORAL_TABLET | Freq: Every day | ORAL | 0 refills | Status: AC

## 2019-08-01 NOTE — ED Provider Notes (Signed)
[facility] Emergency Department Provider Note  Time seen: 1:16 PM  I have reviewed the triage vital signs and the nursing notes.   HISTORY  Chief Complaint Shortness of Breath and Cough   HPI Gina Foster is a 34 y.o. female with a past medical history of diabetes, gastric reflux, presents to the emergency department for cough shortness of breath and back pain.  According to the patient approximately 2 weeks ago she developed an upper respiratory illness in which she had nasal congestion cough.  States the congestion has largely cleared but she continues to have a dry cough.  Over the past several days the cough has gotten worse and she is now having pain in her left back worse with deep inspiration and coughing.  States at times she will start coughing and then vomit due to the coughing.  Patient is a smoker.  Patient has had her Covid vaccinations.  Denies any fever.   Past Medical History:  Diagnosis Date  . Diabetes mellitus without complication (HCC)   . GERD (gastroesophageal reflux disease)     Patient Active Problem List   Diagnosis Date Noted  . Postoperative state 07/29/2016  . Pregnancy 07/25/2016    Past Surgical History:  Procedure Laterality Date  . ABDOMINAL SURGERY    . CESAREAN SECTION    . CESAREAN SECTION WITH BILATERAL TUBAL LIGATION Bilateral 07/29/2016   Procedure: CESAREAN SECTION WITH BILATERAL TUBAL LIGATION;  Surgeon: Suzy Bouchard, MD;  Location: [facility] ORS;  Service: Obstetrics;  Laterality: Bilateral;  . CHOLECYSTECTOMY      Prior to Admission medications   Medication Sig Start Date End Date Taking? Authorizing Provider  azithromycin (ZITHROMAX Z-PAK) 250 MG tablet 2 pills today then 1 pill a day for 4 days 05/28/17   Sherrie Mustache Roselyn Bering, PA-C  ipratropium-albuterol (DUONEB) 0.5-2.5 (3) MG/3ML SOLN Take 3 mLs by nebulization every 4 (four) hours as needed. 05/28/17   Fisher, Roselyn Bering, PA-C  predniSONE (DELTASONE) 10 MG  tablet Take 6 pills on day 1 and decrease by 1 pill each day until all are gone.  Take these early in the morning. 05/28/17   Faythe Ghee, PA-C    Allergies  Allergen Reactions  . Morphine And Related Hives and Shortness Of Breath    Family History  Problem Relation Age of Onset  . Cancer Father   . Diabetes Father   . Hearing loss Father     Social History Social History   Tobacco Use  . Smoking status: Current Every Day Smoker    Packs/day: 1.00    Years: 15.00    Pack years: 15.00    Types: Cigarettes  . Smokeless tobacco: Never Used  Vaping Use  . Vaping Use: Former  Substance Use Topics  . Alcohol use: No  . Drug use: No    Review of Systems Constitutional: Negative for fever. Cardiovascular: Positive for left posterior chest pain.  Worse with deep inspiration. Respiratory: Positive for cough.  Positive for shortness of breath. Gastrointestinal: Negative for abdominal pain.  Occasional posttussive emesis. Musculoskeletal: Negative for musculoskeletal complaints Skin: Negative for skin complaints  Neurological: Negative for headache All other ROS negative  ____________________________________________   PHYSICAL EXAM:  VITAL SIGNS: ED Triage Vitals  Enc Vitals Group     BP 08/01/19 1041 (!) 147/114     Pulse Rate 08/01/19 1041 88     Resp 08/01/19 1041 20     Temp 08/01/19 1041 97.7 F (36.5  C)     Temp src --      SpO2 08/01/19 1041 93 %     Weight 08/01/19 1042 220 lb (99.8 kg)     Height 08/01/19 1042 5\' 9"  (1.753 m)     Head Circumference --      Peak Flow --      Pain Score 08/01/19 1041 6     Pain Loc --      Pain Edu? --      Excl. in GC? --     Constitutional: Alert and oriented. Well appearing and in no distress. Eyes: Normal exam ENT      Head: Normocephalic and atraumatic.      Mouth/Throat: Mucous membranes are moist. Cardiovascular: Normal rate, regular rhythm. Respiratory: Normal respiratory effort without tachypnea nor  retractions.  Patient has slight expiratory wheeze bilaterally.  Some rhonchi in the bases that largely clears after coughing. Gastrointestinal: Soft and nontender. No distention. Musculoskeletal: Nontender with normal range of motion in all extremities.  Neurologic:  Normal speech and language. No gross focal neurologic deficit Skin:  Skin is warm, dry and intact.  Psychiatric: Mood and affect are normal.   ____________________________________________    EKG  EKG viewed and interpreted by myself shows a normal sinus rhythm 86 bpm with a narrow QRS, normal axis, normal intervals, no concerning ST changes.  ____________________________________________    RADIOLOGY  Chest x-ray is clear CTA negative for PE or obvious pneumonia.  ____________________________________________   INITIAL IMPRESSION / ASSESSMENT AND PLAN / ED COURSE  Pertinent labs & imaging results that were available during my care of the patient were reviewed by me and considered in my medical decision making (see chart for details).   Patient presents emergency department for cough now with posterior chest pain/upper back pain worse with deep inspiration or coughing.  Patient has frequent cough during my examination.  Differential would include URI, pneumonia, bronchitis.  Patient's initial work-up is largely nonrevealing including a normal chest x-ray and reassuring EKG.  We will proceed with CTA imaging of the chest to rule out PE or occult pneumonia.  Patient agreeable to plan of care.  CT largely negative besides small lymph nodes possibly reactive.  Given the patient's clinical picture we will discharge with antibiotics and steroids to cover for acute bronchitis.  We will also discharged with a codeine-based cough medication.  Patient specifically denies allergy to codeine contrary to allergy list.  Gina Foster was evaluated in Emergency Department on 08/01/2019 for the symptoms described in the history of  present illness. She was evaluated in the context of the global COVID-19 pandemic, which necessitated consideration that the patient might be at risk for infection with the SARS-CoV-2 virus that causes COVID-19. Institutional protocols and algorithms that pertain to the evaluation of patients at risk for COVID-19 are in a state of rapid change based on information released by regulatory bodies including the CDC and federal and state organizations. These policies and algorithms were followed during the patient's care in the ED.  ____________________________________________   FINAL CLINICAL IMPRESSION(S) / ED DIAGNOSES  Chest pain Cough Bronchitis   08/03/2019, MD 08/01/19 1527

## 2019-08-01 NOTE — ED Triage Notes (Signed)
Pt comes via POV from home with c/o SOB and cough. Pt states this has been going on for over a week. Pt states pain in left upper back .  Pt states dry cough. Pt states she feels she can't get a deep breath.

## 2020-09-06 ENCOUNTER — Other Ambulatory Visit: Payer: Self-pay

## 2020-09-06 ENCOUNTER — Emergency Department
Admission: EM | Admit: 2020-09-06 | Discharge: 2020-09-06 | Disposition: A | Discharge status: Home / Self Care | Payer: Self-pay | Attending: Emergency Medicine | Admitting: Emergency Medicine

## 2020-09-06 DIAGNOSIS — Z5321 Procedure and treatment not carried out due to patient leaving prior to being seen by health care provider: Secondary | ICD-10-CM | POA: Insufficient documentation

## 2020-09-06 DIAGNOSIS — R1110 Vomiting, unspecified: Secondary | ICD-10-CM | POA: Insufficient documentation

## 2020-09-06 DIAGNOSIS — R109 Unspecified abdominal pain: Secondary | ICD-10-CM | POA: Insufficient documentation

## 2020-09-06 LAB — URINALYSIS, COMPLETE (UACMP) WITH MICROSCOPIC
Bacteria, UA: NONE SEEN
Bilirubin Urine: NEGATIVE
Glucose, UA: NEGATIVE mg/dL
Hgb urine dipstick: NEGATIVE
Ketones, ur: 5 mg/dL — AB
Leukocytes,Ua: NEGATIVE
Nitrite: NEGATIVE
Protein, ur: NEGATIVE mg/dL
Specific Gravity, Urine: 1.025 (ref 1.005–1.030)
pH: 5 (ref 5.0–8.0)

## 2020-09-06 LAB — CBC
HCT: 43.1 % (ref 36.0–46.0)
Hemoglobin: 14.8 g/dL (ref 12.0–15.0)
MCH: 30.6 pg (ref 26.0–34.0)
MCHC: 34.3 g/dL (ref 30.0–36.0)
MCV: 89 fL (ref 80.0–100.0)
Platelets: 329 10*3/uL (ref 150–400)
RBC: 4.84 MIL/uL (ref 3.87–5.11)
RDW: 13.9 % (ref 11.5–15.5)
WBC: 11.4 10*3/uL — ABNORMAL HIGH (ref 4.0–10.5)
nRBC: 0 % (ref 0.0–0.2)

## 2020-09-06 LAB — COMPREHENSIVE METABOLIC PANEL
ALT: 13 U/L (ref 0–44)
AST: 19 U/L (ref 15–41)
Albumin: 4 g/dL (ref 3.5–5.0)
Alkaline Phosphatase: 70 U/L (ref 38–126)
Anion gap: 10 (ref 5–15)
BUN: 9 mg/dL (ref 6–20)
CO2: 26 mmol/L (ref 22–32)
Calcium: 8.8 mg/dL — ABNORMAL LOW (ref 8.9–10.3)
Chloride: 102 mmol/L (ref 98–111)
Creatinine, Ser: 0.77 mg/dL (ref 0.44–1.00)
GFR, Estimated: >60 mL/min (ref >60)
Glucose, Bld: 104 mg/dL — ABNORMAL HIGH (ref 70–99)
Potassium: 3.7 mmol/L (ref 3.5–5.1)
Sodium: 138 mmol/L (ref 135–145)
Total Bilirubin: 0.7 mg/dL (ref 0.3–1.2)
Total Protein: 7.5 g/dL (ref 6.5–8.1)

## 2020-09-06 LAB — POC URINE PREG, ED: Preg Test, Ur: NEGATIVE

## 2020-09-06 LAB — LIPASE, BLOOD: Lipase: 28 U/L (ref 11–51)

## 2020-09-06 MED ORDER — ONDANSETRON 4 MG PO TBDP: 4.0000 mg | ORAL_TABLET | Freq: Once | ORAL | Status: AC

## 2020-09-06 MED ORDER — ONDANSETRON 4 MG PO TBDP
ORAL_TABLET | ORAL | Status: AC
  Administered 2020-09-06: 4 mg via ORAL
  Filled 2020-09-06: qty 1

## 2020-09-06 NOTE — ED Triage Notes (Signed)
Pt has been vomiting since this am and started having abd pain 1.5 hrs ago. Denies any diarrhea, no dysuria or reported fever. Pt denies any hx of the same. Pt to right mid abd, no back pain.
